# Patient Record
Sex: Female | Born: 2000 | Race: White | Hispanic: No | Marital: Single | Smoking: Never smoker
Health system: Southern US, Community
[De-identification: ages and names within clinical notes are randomized; demographics above are authoritative.]

## PROBLEM LIST (undated history)

## (undated) ENCOUNTER — Inpatient Hospital Stay: Payer: Self-pay

## (undated) DIAGNOSIS — Z3400 Encounter for supervision of normal first pregnancy, unspecified trimester: Secondary | ICD-10-CM

## (undated) DIAGNOSIS — E039 Hypothyroidism, unspecified: Secondary | ICD-10-CM

## (undated) DIAGNOSIS — D649 Anemia, unspecified: Secondary | ICD-10-CM

## (undated) HISTORY — PX: FRACTURE SURGERY: SHX138

---
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
## 2018-06-09 DIAGNOSIS — E039 Hypothyroidism, unspecified: Secondary | ICD-10-CM | POA: Insufficient documentation

## 2019-10-30 ENCOUNTER — Other Ambulatory Visit: Payer: Self-pay

## 2019-10-30 DIAGNOSIS — Z20822 Contact with and (suspected) exposure to covid-19: Secondary | ICD-10-CM

## 2019-11-01 ENCOUNTER — Telehealth: Payer: Self-pay | Admitting: Pediatrics

## 2019-11-01 LAB — NOVEL CORONAVIRUS, NAA: SARS-CoV-2, NAA: NOT DETECTED

## 2019-11-01 NOTE — Telephone Encounter (Signed)
Negative COVID results given. Patient results "NOT Detected." Caller expressed understanding. ° °

## 2020-03-13 ENCOUNTER — Emergency Department
Admission: EM | Admit: 2020-03-13 | Discharge: 2020-03-13 | Disposition: A | Discharge status: Home / Self Care | Payer: Medicaid Other | Attending: Emergency Medicine | Admitting: Emergency Medicine

## 2020-03-13 ENCOUNTER — Other Ambulatory Visit: Payer: Self-pay

## 2020-03-13 ENCOUNTER — Emergency Department: Payer: Medicaid Other

## 2020-03-13 ENCOUNTER — Encounter: Payer: Self-pay | Admitting: Emergency Medicine

## 2020-03-13 DIAGNOSIS — Z79899 Other long term (current) drug therapy: Secondary | ICD-10-CM | POA: Insufficient documentation

## 2020-03-13 DIAGNOSIS — N3000 Acute cystitis without hematuria: Secondary | ICD-10-CM | POA: Diagnosis not present

## 2020-03-13 DIAGNOSIS — R109 Unspecified abdominal pain: Secondary | ICD-10-CM | POA: Diagnosis present

## 2020-03-13 LAB — CBC
HCT: 41.7 % (ref 36.0–46.0)
Hemoglobin: 14 g/dL (ref 12.0–15.0)
MCH: 29.7 pg (ref 26.0–34.0)
MCHC: 33.6 g/dL (ref 30.0–36.0)
MCV: 88.3 fL (ref 80.0–100.0)
Platelets: 303 10*3/uL (ref 150–400)
RBC: 4.72 MIL/uL (ref 3.87–5.11)
RDW: 12.3 % (ref 11.5–15.5)
WBC: 8.5 10*3/uL (ref 4.0–10.5)
nRBC: 0 % (ref 0.0–0.2)

## 2020-03-13 LAB — URINALYSIS, COMPLETE (UACMP) WITH MICROSCOPIC
Bilirubin Urine: NEGATIVE
Glucose, UA: NEGATIVE mg/dL
Hgb urine dipstick: NEGATIVE
Ketones, ur: 20 mg/dL — AB
Nitrite: POSITIVE — AB
Protein, ur: 30 mg/dL — AB
Specific Gravity, Urine: 1.023 (ref 1.005–1.030)
pH: 5 (ref 5.0–8.0)

## 2020-03-13 LAB — COMPREHENSIVE METABOLIC PANEL
ALT: 18 U/L (ref 0–44)
AST: 17 U/L (ref 15–41)
Albumin: 4 g/dL (ref 3.5–5.0)
Alkaline Phosphatase: 67 U/L (ref 38–126)
Anion gap: 11 (ref 5–15)
BUN: 9 mg/dL (ref 6–20)
CO2: 24 mmol/L (ref 22–32)
Calcium: 9.4 mg/dL (ref 8.9–10.3)
Chloride: 103 mmol/L (ref 98–111)
Creatinine, Ser: 0.6 mg/dL (ref 0.44–1.00)
GFR calc Af Amer: >60 mL/min (ref >60)
GFR calc non Af Amer: >60 mL/min (ref >60)
Glucose, Bld: 97 mg/dL (ref 70–99)
Potassium: 4.3 mmol/L (ref 3.5–5.1)
Sodium: 138 mmol/L (ref 135–145)
Total Bilirubin: 0.7 mg/dL (ref 0.3–1.2)
Total Protein: 7.7 g/dL (ref 6.5–8.1)

## 2020-03-13 LAB — LIPASE, BLOOD: Lipase: 26 U/L (ref 11–51)

## 2020-03-13 LAB — POCT PREGNANCY, URINE: Preg Test, Ur: NEGATIVE

## 2020-03-13 MED ORDER — TRAMADOL HCL 50 MG PO TABS: 50.0000 mg | ORAL_TABLET | Freq: Four times a day (QID) | ORAL | 0 refills | Status: AC | PRN

## 2020-03-13 MED ORDER — SULFAMETHOXAZOLE-TRIMETHOPRIM 800-160 MG PO TABS: 1.0000 | ORAL_TABLET | Freq: Two times a day (BID) | ORAL | 0 refills | Status: AC

## 2020-03-13 MED ORDER — SULFAMETHOXAZOLE-TRIMETHOPRIM 800-160 MG PO TABS
1.0000 | ORAL_TABLET | Freq: Once | ORAL | Status: AC
  Administered 2020-03-13: 1 via ORAL
  Filled 2020-03-13: qty 1

## 2020-03-13 MED ORDER — ONDANSETRON HCL 4 MG/2ML IJ SOLN
4.0000 mg | Freq: Once | INTRAMUSCULAR | Status: AC
  Administered 2020-03-13: 4 mg via INTRAVENOUS
  Filled 2020-03-13: qty 2

## 2020-03-13 MED ORDER — MORPHINE SULFATE (PF) 2 MG/ML IV SOLN
2.0000 mg | Freq: Once | INTRAVENOUS | Status: AC
  Administered 2020-03-13: 2 mg via INTRAVENOUS
  Filled 2020-03-13: qty 1

## 2020-03-13 NOTE — ED Triage Notes (Signed)
Pt comes POV with "side pains" from left side to stomach to back and shoulder blade. Pt states it started Sunday. Pt was expecting period but it never came. Pt states it feels muscular but it also hurts when she breathes.

## 2020-03-13 NOTE — ED Provider Notes (Signed)
[facility] Emergency Department Provider Note  ____________________________________________   First MD Initiated Contact with Patient 03/13/20 1514     (approximate)  I have reviewed the triage vital signs and the nursing notes.   HISTORY  Chief Complaint side pain    HPI Carol Gentry is a 19 y.o. female with below list of previous medical conditions presents to the emergency department secondary to right-sided abdominal pain with radiation to her back and shoulder since Sunday with associated urinary urgency and frequency.  Patient denies any fever no diarrhea constipation.       Past Medical History:  Diagnosis Date  . Anemia   . Supervision of normal first teen pregnancy     Patient Active Problem List   Diagnosis Date Noted  . Elevated blood pressure affecting pregnancy in third trimester, antepartum 07/13/2017  . Term pregnancy 06/30/2017  . Cramping affecting pregnancy, antepartum 06/07/2017  . Preterm uterine contractions 05/10/2017    Past Surgical History:  Procedure Laterality Date  . FRACTURE SURGERY    . ORIF TOE FRACTURE Right 11/15/2015   Procedure: OPEN REDUCTION INTERNAL FIXATION (ORIF) METATARSAL (TOE) FRACTURE;  Surgeon: Samara Deist, DPM;  Location: [facility] ORS;  Service: Podiatry;  Laterality: Right;    Prior to Admission medications   Medication Sig Start Date End Date Taking? Authorizing Provider  benzocaine-Menthol (DERMOPLAST) 20-0.5 % AERO Apply 1 application topically as needed for irritation (perineal discomfort). 07/17/17   Schermerhorn, Gwen Her, MD  ibuprofen (ADVIL,MOTRIN) 600 MG tablet Take 1 tablet (600 mg total) by mouth every 6 (six) hours. 07/17/17   Schermerhorn, Gwen Her, MD  labetalol (NORMODYNE) 100 MG tablet Take 1 tablet (100 mg total) by mouth 2 (two) times daily. 07/17/17   Schermerhorn, Gwen Her, MD  sulfamethoxazole-trimethoprim (BACTRIM DS) 800-160 MG tablet Take 1 tablet by mouth 2 (two) times  daily for 5 days. 03/13/20 03/18/20  Gregor Hams, MD  traMADol (ULTRAM) 50 MG tablet Take 1 tablet (50 mg total) by mouth every 6 (six) hours as needed. 03/13/20 03/13/21  Gregor Hams, MD    Allergies Amoxicillin  History reviewed. No pertinent family history.  Social History Social History   Tobacco Use  . Smoking status: Never Smoker  . Smokeless tobacco: Never Used  Substance Use Topics  . Alcohol use: No  . Drug use: No    Review of Systems Constitutional: No fever/chills Eyes: No visual changes. ENT: No sore throat. Cardiovascular: Denies chest pain. Respiratory: Denies shortness of breath. Gastrointestinal: Positive for abdominal pain and nausea Genitourinary: Negative for dysuria. Musculoskeletal: Negative for neck pain.  Negative for back pain. Integumentary: Negative for rash. Neurological: Negative for headaches, focal weakness or numbness.   ____________________________________________   PHYSICAL EXAM:  VITAL SIGNS: ED Triage Vitals  Enc Vitals Group     BP 03/13/20 1240 (!) 99/53     Pulse Rate 03/13/20 1240 82     Resp 03/13/20 1240 16     Temp 03/13/20 1240 98.6 F (37 C)     Temp Source 03/13/20 1240 Oral     SpO2 03/13/20 1240 98 %     Weight 03/13/20 1239 48.5 kg (107 lb)     Height 03/13/20 1239 1.549 m (5\' 1" )     Head Circumference --      Peak Flow --      Pain Score 03/13/20 1245 10     Pain Loc --      Pain Edu? --  Excl. in GC? --      Constitutional: Alert and oriented.  Eyes: Conjunctivae are normal.  Mouth/Throat: Patient is wearing a mask. Neck: No stridor.  No meningeal signs.   Cardiovascular: Normal rate, regular rhythm. Good peripheral circulation. Grossly normal heart sounds. Respiratory: Normal respiratory effort.  No retractions. Gastrointestinal: Soft and nontender. No distention.  Musculoskeletal: No lower extremity tenderness nor edema. No gross deformities of extremities. Neurologic:  Normal speech  and language. No gross focal neurologic deficits are appreciated.  Skin:  Skin is warm, dry and intact. Psychiatric: Mood and affect are normal. Speech and behavior are normal.  ____________________________________________   LABS (all labs ordered are listed, but only abnormal results are displayed)  Labs Reviewed  URINALYSIS, COMPLETE (UACMP) WITH MICROSCOPIC - Abnormal; Notable for the following components:      Result Value   Color, Urine YELLOW (*)    APPearance HAZY (*)    Ketones, ur 20 (*)    Protein, ur 30 (*)    Nitrite POSITIVE (*)    Leukocytes,Ua TRACE (*)    Bacteria, UA RARE (*)    All other components within normal limits  LIPASE, BLOOD  COMPREHENSIVE METABOLIC PANEL  CBC  POC URINE PREG, ED  POCT PREGNANCY, URINE   __________________________________________  RADIOLOGY I, Forada N Persephonie Hegwood, personally viewed and evaluated these images (plain radiographs) as part of my medical decision making, as well as reviewing the written report by the radiologist.  ED MD interpretation: No acute findings noted on CT abdomen and pelvis per radiologist.  Official radiology report(s): CT Renal Stone Study  Result Date: 03/13/2020 CLINICAL DATA:  19 year old female with history of left-sided flank pain for the past 4 days. EXAM: CT ABDOMEN AND PELVIS WITHOUT CONTRAST TECHNIQUE: Multidetector CT imaging of the abdomen and pelvis was performed following the standard protocol without IV contrast. COMPARISON:  No priors. FINDINGS: Lower chest: Unremarkable. Hepatobiliary: No suspicious cystic or solid hepatic lesions are confidently identified on today's noncontrast CT examination. Unenhanced appearance of the gallbladder is normal. Pancreas: No definite pancreatic mass or peripancreatic fluid collections or inflammatory changes are noted on today's noncontrast CT examination. Spleen: Unremarkable. Adrenals/Urinary Tract: There are no abnormal calcifications within the collecting system  of either kidney, along the course of either ureter, or within the lumen of the urinary bladder. No hydroureteronephrosis or perinephric stranding to suggest urinary tract obstruction at this time. The unenhanced appearance of the kidneys is unremarkable bilaterally. Unenhanced appearance of the urinary bladder is normal. Bilateral adrenal glands are normal in appearance. Stomach/Bowel: Unenhanced appearance of the stomach is normal. No pathologic dilatation of small bowel or colon. The appendix is not confidently identified and may be surgically absent. Regardless, there are no inflammatory changes noted adjacent to the cecum to suggest the presence of an acute appendicitis at this time. Vascular/Lymphatic: No atherosclerotic calcifications in the abdominal aorta or pelvic vasculature. No lymphadenopathy noted in the abdomen or pelvis. Reproductive: Uterus and ovaries are unremarkable in appearance. Other: No significant volume of ascites.  No pneumoperitoneum. Musculoskeletal: There are no aggressive appearing lytic or blastic lesions noted in the visualized portions of the skeleton. IMPRESSION: 1. No acute findings are noted in the abdomen or pelvis to account for the patient's symptoms. Specifically, no urinary tract calculi or findings of urinary tract obstruction. Electronically Signed   By: Trudie Reed M.D.   On: 03/13/2020 16:12      Procedures   ____________________________________________   INITIAL IMPRESSION / MDM / ASSESSMENT  AND PLAN / ED COURSE  As part of my medical decision making, I reviewed the following data within the electronic MEDICAL RECORD NUMBER  19 year old female presented with above-stated history and physical exam differential diagnosis including but not limited to ureterolithiasis cholelithiasis cystitis and pyelonephritis.  Urinalysis consistent with cystitis.  No CVA tenderness on exam.  CT revealed no acute intra-abdominal finding.  Patient prescribed Bactrim DS for  home.  ____________________________________________  FINAL CLINICAL IMPRESSION(S) / ED DIAGNOSES  Final diagnoses:  Acute cystitis without hematuria     MEDICATIONS GIVEN DURING THIS VISIT:  Medications  morphine 2 MG/ML injection 2 mg (2 mg Intravenous Given 03/13/20 1536)  ondansetron (ZOFRAN) injection 4 mg (4 mg Intravenous Given 03/13/20 1535)  sulfamethoxazole-trimethoprim (BACTRIM DS) 800-160 MG per tablet 1 tablet (1 tablet Oral Given 03/13/20 1658)     ED Discharge Orders         Ordered    sulfamethoxazole-trimethoprim (BACTRIM DS) 800-160 MG tablet  2 times daily     03/13/20 1631    traMADol (ULTRAM) 50 MG tablet  Every 6 hours PRN     03/13/20 1632          *Please note:  Carol Gentry was evaluated in Emergency Department on 03/13/2020 for the symptoms described in the history of present illness. She was evaluated in the context of the global COVID-19 pandemic, which necessitated consideration that the patient might be at risk for infection with the SARS-CoV-2 virus that causes COVID-19. Institutional protocols and algorithms that pertain to the evaluation of patients at risk for COVID-19 are in a state of rapid change based on information released by regulatory bodies including the CDC and federal and state organizations. These policies and algorithms were followed during the patient's care in the ED.  Some ED evaluations and interventions may be delayed as a result of limited staffing during the pandemic.*  Note:  This document was prepared using Dragon voice recognition software and may include unintentional dictation errors.   Darci Current, MD 03/13/20 2124

## 2020-10-15 IMAGING — CT CT RENAL STONE PROTOCOL
3 of 4 series · 8 of 46 positions shown, 13 images · non-contrast
Comparison: No priors.

CLINICAL DATA: 18-year-old female with history of left-sided flank
pain for the past 4 days.

EXAM:
CT ABDOMEN AND PELVIS WITHOUT CONTRAST
TECHNIQUE: Multidetector CT imaging of the abdomen and pelvis was performed
following the standard protocol without IV contrast.

[Series 4: lung bases · axial · 0.57mm/px · z∈[-141,-81]mm · 4 of 20 slices shown, 9 images]
[im 4/20  soft-tissue]
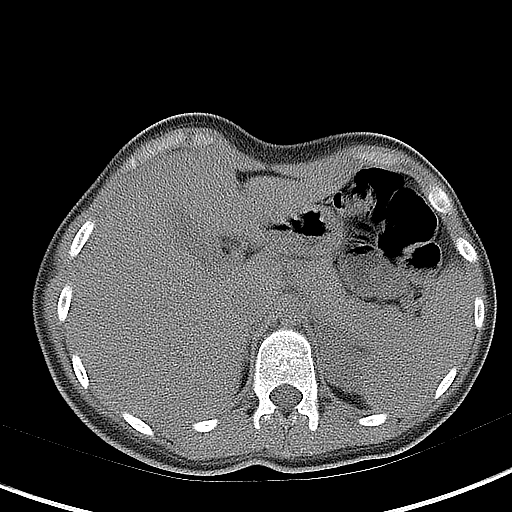
[im 4/20  lung]
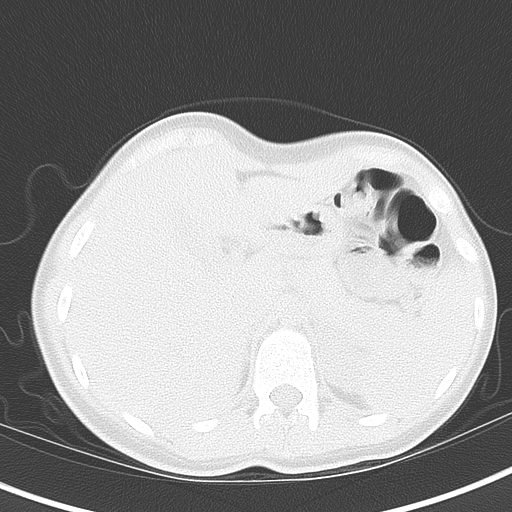
[im 4/20  bone]
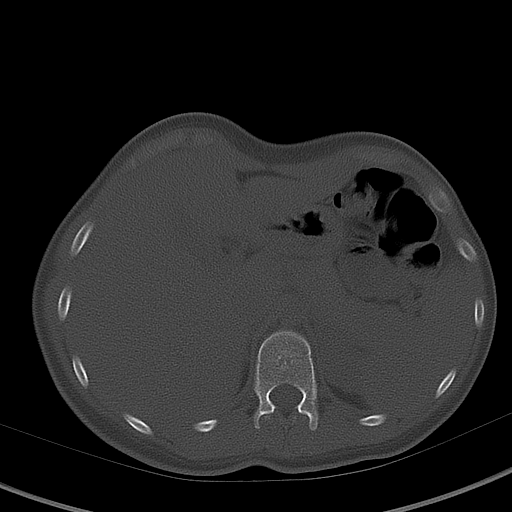
[im 8/20  soft-tissue]
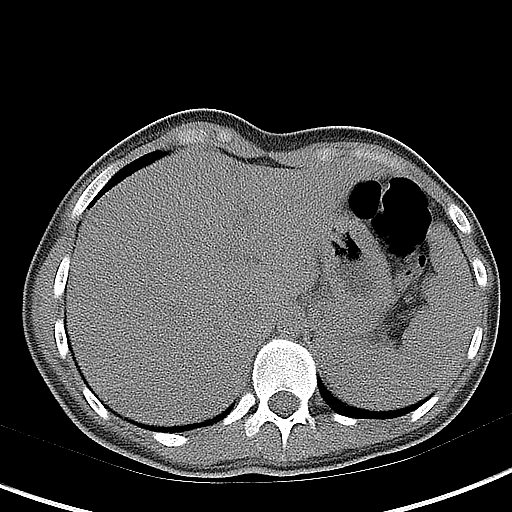
[im 8/20  lung]
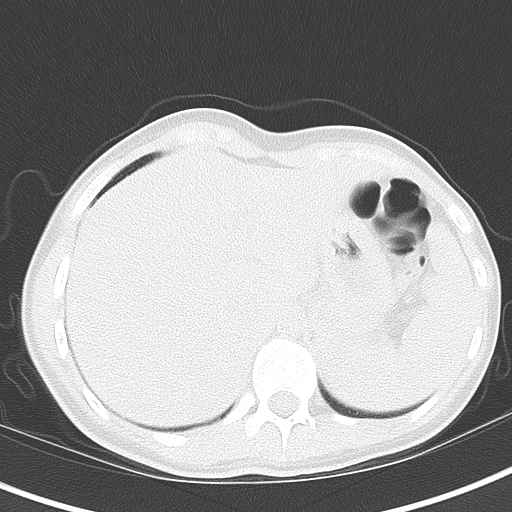
[im 12/20  soft-tissue]
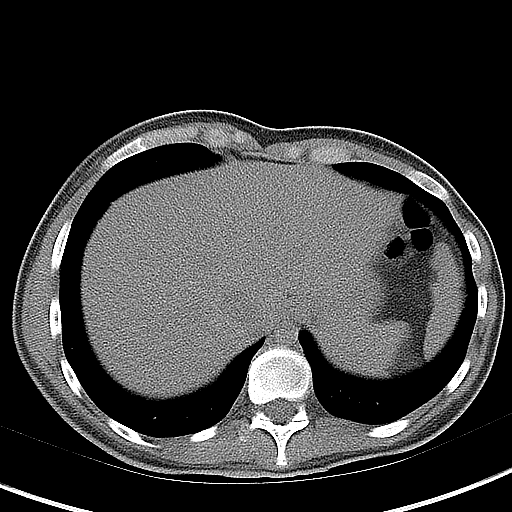
[im 12/20  lung]
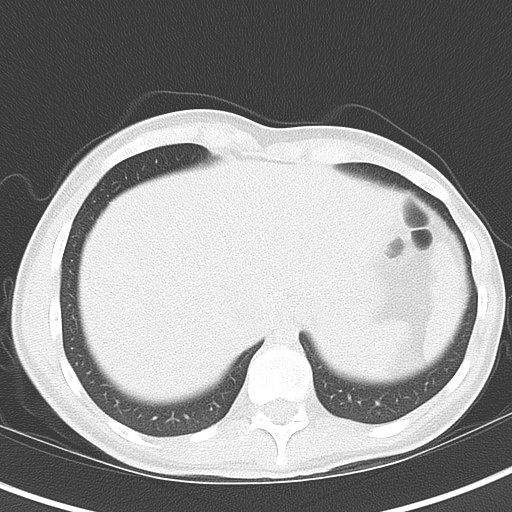
[im 16/20  soft-tissue]
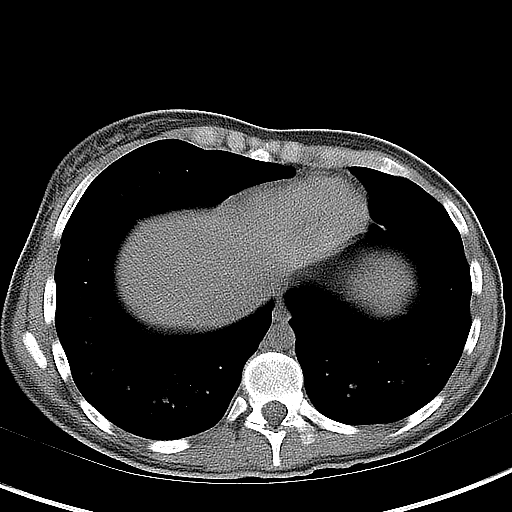
[im 16/20  lung]
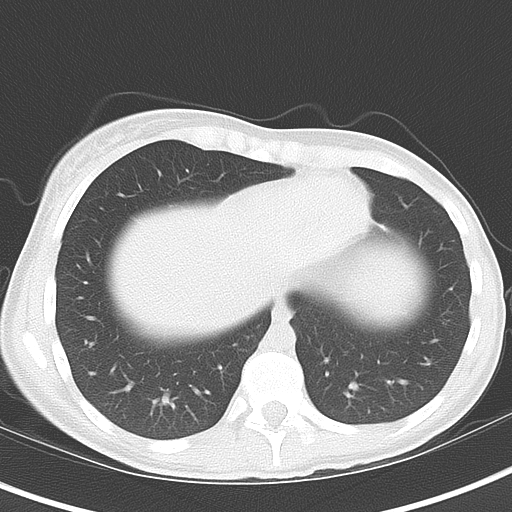

[Series 5: coronal · coronal · 0.61mm/px · 3 of 111 slices shown]
[im 37/111  soft-tissue]
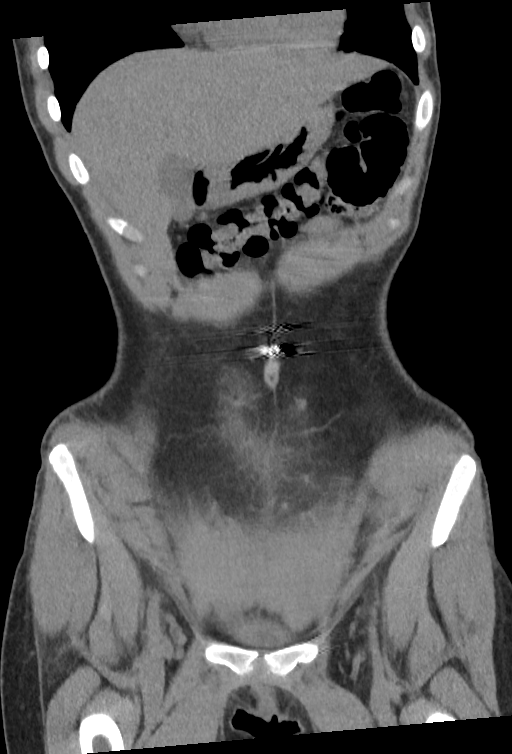
[im 49/111  soft-tissue]
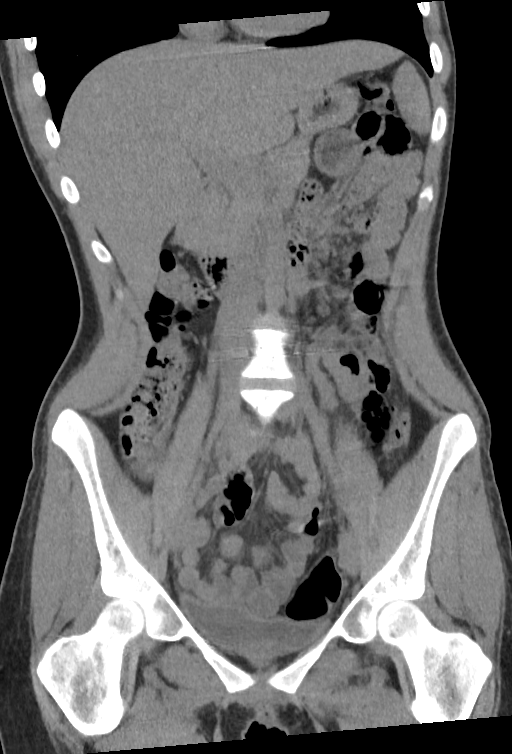
[im 62/111  soft-tissue]
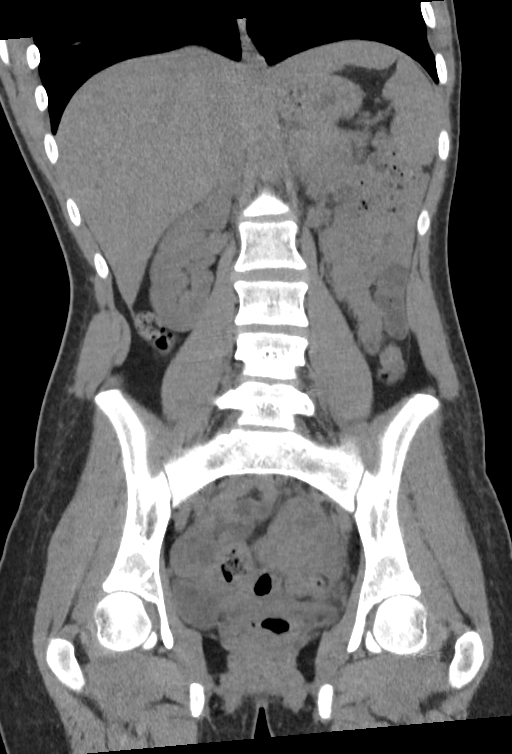

[Series 6: sagittal · sagittal · 0.40mm/px · 1 of 173 slices shown]
[im 58/173  soft-tissue]
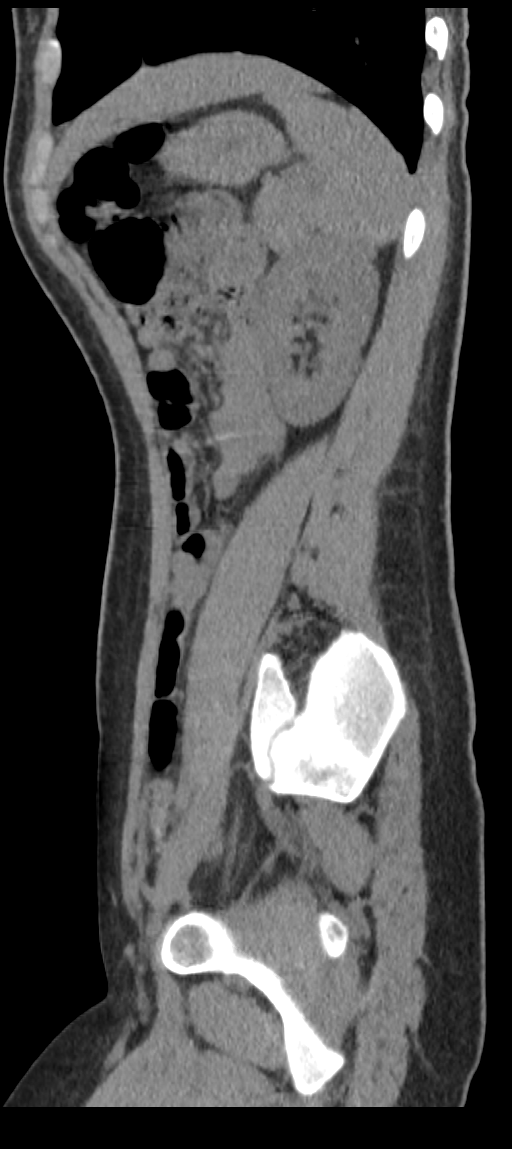

[8 of 46 positions shown; findings below may reference images not displayed]

FINDINGS: Lower chest: Unremarkable.

Hepatobiliary: No suspicious cystic or solid hepatic lesions are
confidently identified on today's noncontrast CT examination.
Unenhanced appearance of the gallbladder is normal.

Pancreas: No definite pancreatic mass or peripancreatic fluid
collections or inflammatory changes are noted on today's noncontrast
CT examination.

Spleen: Unremarkable.

Adrenals/Urinary Tract: There are no abnormal calcifications within
the collecting system of either kidney, along the course of either
ureter, or within the lumen of the urinary bladder. No
hydroureteronephrosis or perinephric stranding to suggest urinary
tract obstruction at this time. The unenhanced appearance of the
kidneys is unremarkable bilaterally. Unenhanced appearance of the
urinary bladder is normal. Bilateral adrenal glands are normal in
appearance.

Stomach/Bowel: Unenhanced appearance of the stomach is normal. No
pathologic dilatation of small bowel or colon. The appendix is not
confidently identified and may be surgically absent. Regardless,
there are no inflammatory changes noted adjacent to the cecum to
suggest the presence of an acute appendicitis at this time.

Vascular/Lymphatic: No atherosclerotic calcifications in the
abdominal aorta or pelvic vasculature. No lymphadenopathy noted in
the abdomen or pelvis.

Reproductive: Uterus and ovaries are unremarkable in appearance.

Other: No significant volume of ascites.  No pneumoperitoneum.

Musculoskeletal: There are no aggressive appearing lytic or blastic
lesions noted in the visualized portions of the skeleton.
IMPRESSION: 1. No acute findings are noted in the abdomen or pelvis to account
for the patient's symptoms. Specifically, no urinary tract calculi
or findings of urinary tract obstruction.

## 2020-12-28 NOTE — L&D Delivery Note (Signed)
Delivery Note  Carol Gentry is a [id number] at [redacted]w[redacted]d with an LMP of 07/18/2021, not consistent with [place] at [redacted]w[redacted]d, with an Estimated Date of Delivery: 08/05/21.   First Stage: Labor onset: 0530 Augmentation: AROM Analgesia /Anesthesia intrapartum: Epidural  AROM at 0954  Second Stage: Complete dilation at 1111 Onset of pushing at 1112 FHR second stage 135 with moderate variability, intermittent lates and variables with pushing   [name] presented to L&D with regular, painful contractions in advanced labor.  She was expectantly managed, AROM for large amount of clear fluid after epidural.  She progressed well to C/C/+2 with an urge to push.  She pushed effectively over approximately 20 minutes (4 contractions over 20 minutes) for a spontaneous vaginal birth.   Delivery of a viable baby boy on 08/04/2021 at 1132 by CNM Delivery of fetal head in OA position with restitution to LOP. No nuchal cord;  Anterior then posterior shoulders delivered easily with gentle downward traction. Baby placed on mom's chest, and attended to by baby RN Cord double clamped after cessation of pulsation, cut by father of baby.  Cord blood sample collected: O pos   Third Stage: Oxytocin bolus started after delivery of infant for hemorrhage prophylaxis  Placenta delivered intact with 3 VC @ 1140 Placenta disposition: discarded  Uterine tone firm / bleeding small to moderate  No laceration identified - small 2cm by 2cm hematoma noted at introitus.  Reviewed size and location with L&D nurse and discussed with patient and family.  Instructed to use ice and pressure for at least 24 hours.   Anesthesia for repair: N/A Repair N/A Est. Blood Loss (mL):   Complications: None   Mom to postpartum.  Baby to Couplet care / Skin to Skin.  Newborn: Information for the patient's newborn:  [name], [name] [[id number]]  Live born female  Birth Weight: Pending   APGAR: 8, 9  Newborn Delivery   Birth date/time: 08/04/2021  11:32:00 Delivery type: Vaginal, Spontaneous      Feeding planned: breast   ---------- Margaretmary Eddy, CNM Certified Nurse Midwife [facility]

## 2021-01-16 DIAGNOSIS — Z3493 Encounter for supervision of normal pregnancy, unspecified, third trimester: Secondary | ICD-10-CM | POA: Insufficient documentation

## 2021-01-22 LAB — OB RESULTS CONSOLE HEPATITIS B SURFACE ANTIGEN: Hepatitis B Surface Ag: NEGATIVE

## 2021-01-22 LAB — OB RESULTS CONSOLE RUBELLA ANTIBODY, IGM: Rubella: IMMUNE

## 2021-01-22 LAB — OB RESULTS CONSOLE VARICELLA ZOSTER ANTIBODY, IGG: Varicella: IMMUNE

## 2021-01-30 DIAGNOSIS — F319 Bipolar disorder, unspecified: Secondary | ICD-10-CM | POA: Insufficient documentation

## 2021-07-09 LAB — OB RESULTS CONSOLE HIV ANTIBODY (ROUTINE TESTING): HIV: NONREACTIVE

## 2021-07-09 LAB — OB RESULTS CONSOLE GC/CHLAMYDIA
Chlamydia: NEGATIVE
Gonorrhea: NEGATIVE

## 2021-07-09 LAB — OB RESULTS CONSOLE GBS: GBS: NEGATIVE

## 2021-07-09 LAB — OB RESULTS CONSOLE RPR: RPR: NONREACTIVE

## 2021-07-15 ENCOUNTER — Observation Stay
Admission: EM | Admit: 2021-07-15 | Discharge: 2021-07-15 | Disposition: A | Discharge status: Home / Self Care | Payer: BC Managed Care – PPO | Attending: Obstetrics and Gynecology | Admitting: Obstetrics and Gynecology

## 2021-07-15 ENCOUNTER — Encounter: Payer: Self-pay | Admitting: Obstetrics and Gynecology

## 2021-07-15 ENCOUNTER — Other Ambulatory Visit: Payer: Self-pay

## 2021-07-15 DIAGNOSIS — Z3490 Encounter for supervision of normal pregnancy, unspecified, unspecified trimester: Secondary | ICD-10-CM

## 2021-07-15 DIAGNOSIS — O471 False labor at or after 37 completed weeks of gestation: Principal | ICD-10-CM | POA: Insufficient documentation

## 2021-07-15 DIAGNOSIS — Z3A37 37 weeks gestation of pregnancy: Secondary | ICD-10-CM | POA: Insufficient documentation

## 2021-07-15 LAB — URINALYSIS, COMPLETE (UACMP) WITH MICROSCOPIC
Bilirubin Urine: NEGATIVE
Glucose, UA: 50 mg/dL — AB
Hgb urine dipstick: NEGATIVE
Ketones, ur: NEGATIVE mg/dL
Nitrite: NEGATIVE
Protein, ur: NEGATIVE mg/dL
Specific Gravity, Urine: 1.013 (ref 1.005–1.030)
pH: 7 (ref 5.0–8.0)

## 2021-07-15 MED ORDER — OXYCODONE-ACETAMINOPHEN 5-325 MG PO TABS
1.0000 | ORAL_TABLET | Freq: Once | ORAL | Status: AC
  Administered 2021-07-15: 1 via ORAL
  Filled 2021-07-15: qty 1

## 2021-07-15 NOTE — OB Triage Note (Signed)
Pt Carol Gentry 19 y.o. presents to labor and delivery triage reporting contractions  . Pt is a G3P1011 at [redacted]w[redacted]d. Pt denies signs and symptons consistent with rupture of membranes or active vaginal bleeding. Pt reports contractions and states positive fetal movement. Pt describes pain in upper abdomen rating pain 8/10 and lower back pain 7/10 that gets worse when standing, and having discomfort with starting urinary streams.  External FM and TOCO applied to non-tender abdomen and assessing. Initial FHR 155 . Vital signs obtained and within normal limits. Provider notified of pt.

## 2021-07-15 NOTE — OB Triage Note (Signed)
Discharge instructions provided to pt. Reviewed follow up care with pt. Education provided about labor symptoms.Pt verbalizes understanding. Vaginal bleeding and discharge, contractions, and fetal movement reviewed by RN. Pt discharged home with significant other who will be driving the pt after medication administration. (See MAR)

## 2021-07-15 NOTE — Discharge Summary (Signed)
[redacted] weeks EGA with uterine ctx  No LF , vaginal bleeding   Cx 2.5 cm , no change after 1.5 observation . Reassuring fetal monitoring   Give percocet 5/325 mg po and d/c home  with precautions

## 2021-07-16 ENCOUNTER — Observation Stay
Admission: EM | Admit: 2021-07-16 | Discharge: 2021-07-16 | Disposition: A | Discharge status: Home / Self Care | Payer: BC Managed Care – PPO

## 2021-07-16 ENCOUNTER — Encounter: Payer: Self-pay | Admitting: Obstetrics and Gynecology

## 2021-07-16 ENCOUNTER — Other Ambulatory Visit: Payer: Self-pay

## 2021-07-16 DIAGNOSIS — E039 Hypothyroidism, unspecified: Secondary | ICD-10-CM | POA: Diagnosis not present

## 2021-07-16 DIAGNOSIS — Z7982 Long term (current) use of aspirin: Secondary | ICD-10-CM | POA: Insufficient documentation

## 2021-07-16 DIAGNOSIS — O26853 Spotting complicating pregnancy, third trimester: Principal | ICD-10-CM | POA: Insufficient documentation

## 2021-07-16 DIAGNOSIS — Z3A37 37 weeks gestation of pregnancy: Secondary | ICD-10-CM | POA: Diagnosis not present

## 2021-07-16 DIAGNOSIS — Z79899 Other long term (current) drug therapy: Secondary | ICD-10-CM | POA: Insufficient documentation

## 2021-07-16 DIAGNOSIS — O4693 Antepartum hemorrhage, unspecified, third trimester: Secondary | ICD-10-CM | POA: Diagnosis present

## 2021-07-16 DIAGNOSIS — N83299 Other ovarian cyst, unspecified side: Secondary | ICD-10-CM | POA: Insufficient documentation

## 2021-07-16 DIAGNOSIS — R102 Pelvic and perineal pain: Secondary | ICD-10-CM | POA: Insufficient documentation

## 2021-07-16 MED ORDER — CALCIUM CARBONATE ANTACID 500 MG PO CHEW: 2.0000 | CHEWABLE_TABLET | ORAL | Status: DC | PRN

## 2021-07-16 MED ORDER — ACETAMINOPHEN 500 MG PO TABS: 1000.0000 mg | ORAL_TABLET | Freq: Four times a day (QID) | ORAL | Status: DC | PRN

## 2021-07-16 NOTE — Discharge Summary (Signed)
RN reviewed discharge instructions with patient. Gave patient opportunity for questions. All questions answered at this time. Pt verbalized understanding. Pt discharged home. 

## 2021-07-16 NOTE — OB Triage Note (Signed)
Pt presents c/o small amount of bright red bleeding. Pt states every time she goes to the bathroom and wipes "there is more." Pt reports positive fetal movement. Pt denies LOF. Or ctx. Pt denies recent intercourse. Pt states she was here yesterday for ctx but didn't change her cervix so she got sent home. She is back because the bleeding was concerning to her. VSS. Will continue to monitor.

## 2021-07-16 NOTE — Discharge Summary (Signed)
Carol Gentry is a 20 y.o. female. She is at [redacted]w[redacted]d gestation. No LMP recorded. Patient is pregnant. Estimated Date of Delivery: 08/05/21  Prenatal care site: [facility]  Chief complaint: Vaginal bleeding in third trimester.  HPI: Carol Gentry presents to L&D with complaints of light pink vaginal bleeding.  She was evaluated for labor last night and had brown discharge after her cervical exam.  She noticed pink to red when wiping this morning and had a scant amount in her underwear.  She denies contractions or LOF.  Endorses good fetal movement.   Factors complicating pregnancy: History of bipolar depression Hypothyroidism - takes synthroid  History of preeclampsia with previous pregnancy   S: Resting comfortably. no CTX, no LOF,  Active fetal movement.   Maternal Medical History:  Past Medical Hx:  has a past medical history of Anemia and Supervision of normal first teen pregnancy.    Past Surgical Hx:  has a past surgical history that includes ORIF toe fracture (Right, 11/15/2015) and Fracture surgery.   Allergies  Allergen Reactions   Amoxicillin Hives     Prior to Admission medications   Medication Sig Start Date End Date Taking? Authorizing Provider  aspirin 81 MG chewable tablet Chew by mouth daily.   Yes [provider]  levothyroxine (SYNTHROID) 200 MCG tablet Take 200 mcg by mouth daily before breakfast.   Yes [provider]  Prenatal Vit-Fe Fumarate-FA (PRENATAL MULTIVITAMIN) TABS tablet Take 1 tablet by mouth daily at 12 noon.   Yes [provider]  benzocaine-Menthol (DERMOPLAST) 20-0.5 % AERO Apply 1 application topically as needed for irritation (perineal discomfort). Patient not taking: Reported on 07/15/2021 07/17/17   Schermerhorn, Ihor Austin, MD  ibuprofen (ADVIL,MOTRIN) 600 MG tablet Take 1 tablet (600 mg total) by mouth every 6 (six) hours. Patient not taking: Reported on 07/15/2021 07/17/17   Schermerhorn, Ihor Austin, MD  labetalol  (NORMODYNE) 100 MG tablet Take 1 tablet (100 mg total) by mouth 2 (two) times daily. Patient not taking: Reported on 07/15/2021 07/17/17   Schermerhorn, Ihor Austin, MD    Social History: She  reports that she has never smoked. She has never used smokeless tobacco. She reports that she does not drink alcohol and does not use drugs.  Family History: Family history non-contributory, no history of gyn cancers  Review of Systems: A full review of systems was performed and negative except as noted in the HPI.    O:  BP (!) 111/57 (BP Location: Left Arm)   Pulse 89   Temp 98.2 F (36.8 C) (Oral)   Resp 16   Ht 5\' 1"  (1.549 m)   Wt 70.3 kg   SpO2 100%   BMI 29.29 kg/m  Results for orders placed or performed during the hospital encounter of 07/15/21 (from the past 48 hour(s))  Urinalysis, Complete w Microscopic Urine, Clean Catch   Collection Time: 07/15/21  9:35 PM  Result Value Ref Range   Color, Urine YELLOW (A) YELLOW   APPearance HAZY (A) CLEAR   Specific Gravity, Urine 1.013 1.005 - 1.030   pH 7.0 5.0 - 8.0   Glucose, UA 50 (A) NEGATIVE mg/dL   Hgb urine dipstick NEGATIVE NEGATIVE   Bilirubin Urine NEGATIVE NEGATIVE   Ketones, ur NEGATIVE NEGATIVE mg/dL   Protein, ur NEGATIVE NEGATIVE mg/dL   Nitrite NEGATIVE NEGATIVE   Leukocytes,Ua LARGE (A) NEGATIVE   RBC / HPF 0-5 0 - 5 RBC/hpf   WBC, UA 11-20 0 - 5 WBC/hpf  Bacteria, UA RARE (A) NONE SEEN   Squamous Epithelial / LPF 11-20 0 - 5   Mucus PRESENT       Constitutional: NAD, AAOx3  HE/ENT: extraocular movements grossly intact, moist mucous membranes CV: RRR PULM: nl respiratory effort, CTABL Abd: gravid, non-tender, non-distended, soft  Ext: Non-tender, Nonedmeatous Psych: mood appropriate, speech normal Pelvic :  small amount of brown to red discharge noted, no clots or active bleeding  SVE: Dilation: 2.5 Effacement (%): 40, 50 Cervical Position: Middle Station: -2 Presentation: Vertex Exam by:: Margaretmary Eddy CNM    Fetal Monitor: Baseline: 130 bpm Variability: moderate Accels: Present Decels: none Toco: none  Category: I   Assessment: 20 y.o. [redacted]w[redacted]d here for antenatal surveillance during pregnancy.  Principle diagnosis: Vaginal bleeding in third trimester    Plan: Labor: not present.  Fetal Wellbeing: Reassuring Cat 1 tracing. Reactive NST  Discharge c/w with cervical irritation from cervical exam.  Bleeding precautions reviewed Pelvic rest for next 48 hours.   D/c home stable, precautions reviewed, follow-up as scheduled.   ----- Margaretmary Eddy, CNM Certified Nurse Midwife [place]  [facility]

## 2021-07-17 LAB — URINE CULTURE: Culture: >=100000 — AB

## 2021-07-27 ENCOUNTER — Other Ambulatory Visit: Payer: Self-pay

## 2021-07-27 ENCOUNTER — Encounter: Payer: Self-pay | Admitting: Obstetrics and Gynecology

## 2021-07-27 ENCOUNTER — Observation Stay
Admission: EM | Admit: 2021-07-27 | Discharge: 2021-07-27 | Disposition: A | Discharge status: Home / Self Care | Payer: BC Managed Care – PPO | Attending: Certified Nurse Midwife | Admitting: Certified Nurse Midwife

## 2021-07-27 DIAGNOSIS — O479 False labor, unspecified: Secondary | ICD-10-CM | POA: Diagnosis present

## 2021-07-27 DIAGNOSIS — O471 False labor at or after 37 completed weeks of gestation: Secondary | ICD-10-CM | POA: Diagnosis not present

## 2021-07-27 DIAGNOSIS — O2343 Unspecified infection of urinary tract in pregnancy, third trimester: Secondary | ICD-10-CM | POA: Insufficient documentation

## 2021-07-27 DIAGNOSIS — B9620 Unspecified Escherichia coli [E. coli] as the cause of diseases classified elsewhere: Secondary | ICD-10-CM | POA: Insufficient documentation

## 2021-07-27 DIAGNOSIS — Z3A38 38 weeks gestation of pregnancy: Secondary | ICD-10-CM | POA: Insufficient documentation

## 2021-07-27 HISTORY — DX: Hypothyroidism, unspecified: E03.9

## 2021-07-27 LAB — WET PREP, GENITAL
Sperm: NONE SEEN
Trich, Wet Prep: NONE SEEN
Yeast Wet Prep HPF POC: NONE SEEN

## 2021-07-27 MED ORDER — METRONIDAZOLE 500 MG PO TABS: 500.0000 mg | ORAL_TABLET | Freq: Two times a day (BID) | ORAL | 0 refills | Status: AC

## 2021-07-27 NOTE — OB Triage Note (Signed)
Pt is a G3P1011 and 104w5d presenting to L&D with c/o ctx that started at 0130 today. Pt reports pain 7/10 on a 0-10 pain scale. Pt reports taking 1,000mg  of Tylenol at 0145 with minimal relief. Pt confirms positive fetal movement and denies VB and LOF. Monitors applied and assessing.

## 2021-07-27 NOTE — Discharge Summary (Addendum)
Patient ID: ZHANA JEANGILLES MRN: [id number] DOB/AGE: Mar 05, 2001 20 y.o.  Admit date: 07/27/2021 Discharge date: 07/27/2021  Admission Diagnoses: G3P1011 and [redacted]w[redacted]d presenting with UCs starting at 0130  Factors complicating pregnancy: H/o mental health diagnoses Thyroid Disease UTI- E. Coli at NOB H/o Pre-E with last pregnancy around 39 weeks Palpitations at 16wks POS GBS IN URINE, TREATED WITH MACROBID 07/21/21  Discharge Diagnoses: False vs early labor  Prenatal Procedures: NST  Consults: None  Significant Diagnostic Studies:  Results for orders placed or performed during the hospital encounter of 07/27/21 (from the past 168 hour(s))  Wet prep, genital   Collection Time: 07/27/21  6:24 AM  Result Value Ref Range   Yeast Wet Prep HPF POC NONE SEEN NONE SEEN   Trich, Wet Prep NONE SEEN NONE SEEN   Clue Cells Wet Prep HPF POC PRESENT (A) NONE SEEN   WBC, Wet Prep HPF POC MANY (A) NONE SEEN   Sperm NONE SEEN     Treatments: antibiotics: metronidazole  Hospital Course:  This is a 20 y.o. G3P1011 with IUP at [redacted]w[redacted]d seen for labor check, noted to have a cervical exam of 2/50/-2.  No leaking of fluid and no bleeding.  She was observed, fetal heart rate monitoring remained reassuring, and she had no signs/symptoms of progressing labor or other maternal-fetal concerns.  Her cervical exam was unchanged from admission.  She was deemed stable for discharge to home with outpatient follow up.  Discharge Physical Exam:  BP 116/63 (BP Location: Right Arm)   Pulse (!) 107   Temp 98 F (36.7 C) (Oral)   Resp 16   Ht 5\' 1"  (1.549 m)   Wt 74.8 kg   BMI 31.18 kg/m   General: NAD CV: RRR Pulm: CTABL, nl effort ABD: s/nd/nt, gravid DVT Evaluation: LE non-ttp, no evidence of DVT on exam.  NST: FHR baseline: 125 bpm Variability: moderate Accelerations: yes Decelerations: none Category/reactivity: reactive  TOCO: quiet SVE: deferred  Dilation: 2 Effacement (%): 50 Cervical  Position: Posterior, Middle Station: -2 Presentation: Vertex Exam by:: [email or web address] RN   Discharge Condition: Stable  Disposition: Discharge disposition: 01-Home or Self Care       Allergies as of 07/27/2021       Reactions   Amoxicillin Hives        Medication List     TAKE these medications    aspirin 81 MG chewable tablet Chew by mouth daily.   levothyroxine 200 MCG tablet Commonly known as: SYNTHROID Take 200 mcg by mouth daily before breakfast.   metroNIDAZOLE 500 MG tablet Commonly known as: Flagyl Take 1 tablet (500 mg total) by mouth 2 (two) times daily for 7 days.   prenatal multivitamin Tabs tablet Take 1 tablet by mouth daily at 12 noon.         Signed8/01/2021, CNM 07/27/2021 12:12 PM

## 2021-07-27 NOTE — Discharge Instructions (Signed)
Please keep your next scheduled appointment. If you have questions or concerns you may call the on call provider.  If you have urgent concerns go to your nearest emergency department for evaluation.

## 2021-08-01 ENCOUNTER — Other Ambulatory Visit: Payer: Self-pay | Admitting: Obstetrics and Gynecology

## 2021-08-01 NOTE — Progress Notes (Signed)
Carol Gentry is a 20 y.o. G34P1011 female at [redacted]w[redacted]d dated by 8 week u/s.  She presents to L&D for elective IOL at term  Pregnancy Issues: Bipolar depression Thyroid Disease Levothyroxine daily UTI- E. Coli at [facility] H/o Pre-E with last pregnancy around 39 weeks POS GBS IN URINE, TREATED WITH MACROBID 07/21/21   Prenatal care site: [facility]   EFW: 07/17/21 3134 g @53 %   Pertinent Results:  Prenatal Labs: Blood type/Rh O Pos  Antibody screen neg  Rubella Immune  Varicella Immune  RPR NR  HBsAg Neg  HIV NR  GC neg  Chlamydia neg  Genetic screening negative  1 hour GTT 74  3 hour GTT   GBS  Pos by urine at [facility] in July    5. Post Partum Planning: - Infant feeding: unknown - Contraception: unknown - Tdap Given 05/15/21 - Flu 04/17/21   Brownie Gockel, CNM 08/01/2021 8:28 AM

## 2021-08-04 ENCOUNTER — Inpatient Hospital Stay: Payer: BC Managed Care – PPO | Admitting: Anesthesiology

## 2021-08-04 ENCOUNTER — Inpatient Hospital Stay
Admission: EM | Admit: 2021-08-04 | Discharge: 2021-08-05 | DRG: 806 | Disposition: A | Discharge status: Home / Self Care | Payer: BC Managed Care – PPO

## 2021-08-04 ENCOUNTER — Encounter: Payer: Self-pay | Admitting: Obstetrics and Gynecology

## 2021-08-04 ENCOUNTER — Other Ambulatory Visit: Payer: Self-pay

## 2021-08-04 DIAGNOSIS — F3130 Bipolar disorder, current episode depressed, mild or moderate severity, unspecified: Secondary | ICD-10-CM | POA: Diagnosis present

## 2021-08-04 DIAGNOSIS — Z3A39 39 weeks gestation of pregnancy: Secondary | ICD-10-CM

## 2021-08-04 DIAGNOSIS — O99824 Streptococcus B carrier state complicating childbirth: Secondary | ICD-10-CM | POA: Diagnosis present

## 2021-08-04 DIAGNOSIS — E039 Hypothyroidism, unspecified: Secondary | ICD-10-CM | POA: Diagnosis present

## 2021-08-04 DIAGNOSIS — Z3493 Encounter for supervision of normal pregnancy, unspecified, third trimester: Secondary | ICD-10-CM

## 2021-08-04 DIAGNOSIS — O26893 Other specified pregnancy related conditions, third trimester: Secondary | ICD-10-CM | POA: Diagnosis present

## 2021-08-04 DIAGNOSIS — D62 Acute posthemorrhagic anemia: Secondary | ICD-10-CM | POA: Diagnosis not present

## 2021-08-04 DIAGNOSIS — Z20822 Contact with and (suspected) exposure to covid-19: Secondary | ICD-10-CM | POA: Diagnosis present

## 2021-08-04 DIAGNOSIS — O479 False labor, unspecified: Secondary | ICD-10-CM

## 2021-08-04 DIAGNOSIS — O99344 Other mental disorders complicating childbirth: Secondary | ICD-10-CM | POA: Diagnosis present

## 2021-08-04 DIAGNOSIS — O9081 Anemia of the puerperium: Secondary | ICD-10-CM | POA: Diagnosis not present

## 2021-08-04 DIAGNOSIS — O99284 Endocrine, nutritional and metabolic diseases complicating childbirth: Secondary | ICD-10-CM | POA: Diagnosis present

## 2021-08-04 DIAGNOSIS — Z3490 Encounter for supervision of normal pregnancy, unspecified, unspecified trimester: Secondary | ICD-10-CM

## 2021-08-04 LAB — CBC
HCT: 37.7 % (ref 36.0–46.0)
Hemoglobin: 12.2 g/dL (ref 12.0–15.0)
MCH: 23.8 pg — ABNORMAL LOW (ref 26.0–34.0)
MCHC: 32.4 g/dL (ref 30.0–36.0)
MCV: 73.5 fL — ABNORMAL LOW (ref 80.0–100.0)
Platelets: 308 10*3/uL (ref 150–400)
RBC: 5.13 MIL/uL — ABNORMAL HIGH (ref 3.87–5.11)
RDW: 15 % (ref 11.5–15.5)
WBC: 8.8 10*3/uL (ref 4.0–10.5)
nRBC: 0 % (ref 0.0–0.2)

## 2021-08-04 LAB — TYPE AND SCREEN
ABO/RH(D): O POS
Antibody Screen: NEGATIVE

## 2021-08-04 LAB — RESP PANEL BY RT-PCR (FLU A&B, COVID) ARPGX2
Influenza A by PCR: NEGATIVE
Influenza B by PCR: NEGATIVE
SARS Coronavirus 2 by RT PCR: NEGATIVE

## 2021-08-04 MED ORDER — OXYTOCIN 10 UNIT/ML IJ SOLN
INTRAMUSCULAR | Status: AC
  Filled 2021-08-04: qty 2

## 2021-08-04 MED ORDER — OXYCODONE-ACETAMINOPHEN 5-325 MG PO TABS: 2.0000 | ORAL_TABLET | ORAL | Status: DC | PRN

## 2021-08-04 MED ORDER — ONDANSETRON HCL 4 MG PO TABS
4.0000 mg | ORAL_TABLET | ORAL | Status: DC | PRN
  Filled 2021-08-04: qty 1

## 2021-08-04 MED ORDER — OXYTOCIN-SODIUM CHLORIDE 30-0.9 UT/500ML-% IV SOLN
INTRAVENOUS | Status: AC
  Administered 2021-08-04: 333 mL via INTRAVENOUS
  Filled 2021-08-04: qty 500

## 2021-08-04 MED ORDER — OXYTOCIN-SODIUM CHLORIDE 30-0.9 UT/500ML-% IV SOLN
2.5000 [IU]/h | INTRAVENOUS | Status: DC
  Administered 2021-08-04: 2.5 [IU]/h via INTRAVENOUS
  Filled 2021-08-04: qty 500

## 2021-08-04 MED ORDER — LURASIDONE HCL 40 MG PO TABS
40.0000 mg | ORAL_TABLET | Freq: Every day | ORAL | Status: DC
  Administered 2021-08-04: 40 mg via ORAL
  Filled 2021-08-04 (×2): qty 1

## 2021-08-04 MED ORDER — LIDOCAINE HCL (PF) 1 % IJ SOLN
INTRAMUSCULAR | Status: AC
  Filled 2021-08-04: qty 30

## 2021-08-04 MED ORDER — ONDANSETRON HCL 4 MG/2ML IJ SOLN: 4.0000 mg | Freq: Four times a day (QID) | INTRAMUSCULAR | Status: DC | PRN

## 2021-08-04 MED ORDER — DIBUCAINE (PERIANAL) 1 % EX OINT
1.0000 "application " | TOPICAL_OINTMENT | CUTANEOUS | Status: DC | PRN
  Administered 2021-08-04: 1 via RECTAL
  Filled 2021-08-04: qty 28

## 2021-08-04 MED ORDER — AMMONIA AROMATIC IN INHA
RESPIRATORY_TRACT | Status: AC
  Filled 2021-08-04: qty 10

## 2021-08-04 MED ORDER — FENTANYL-BUPIVACAINE-NACL 0.5-0.125-0.9 MG/250ML-% EP SOLN
EPIDURAL | Status: AC
  Filled 2021-08-04: qty 250

## 2021-08-04 MED ORDER — DOCUSATE SODIUM 100 MG PO CAPS
100.0000 mg | ORAL_CAPSULE | Freq: Two times a day (BID) | ORAL | Status: DC
  Administered 2021-08-04: 100 mg via ORAL
  Filled 2021-08-04 (×2): qty 1

## 2021-08-04 MED ORDER — LACTATED RINGERS IV SOLN: 500.0000 mL | INTRAVENOUS | Status: DC | PRN

## 2021-08-04 MED ORDER — ONDANSETRON HCL 4 MG/2ML IJ SOLN: 4.0000 mg | INTRAMUSCULAR | Status: DC | PRN

## 2021-08-04 MED ORDER — FENTANYL CITRATE (PF) 100 MCG/2ML IJ SOLN: 50.0000 ug | INTRAMUSCULAR | Status: DC | PRN

## 2021-08-04 MED ORDER — IBUPROFEN 600 MG PO TABS
600.0000 mg | ORAL_TABLET | Freq: Four times a day (QID) | ORAL | Status: DC
  Administered 2021-08-04 – 2021-08-05 (×3): 600 mg via ORAL
  Filled 2021-08-04 (×4): qty 1

## 2021-08-04 MED ORDER — COCONUT OIL OIL
1.0000 "application " | TOPICAL_OIL | Status: DC | PRN
  Administered 2021-08-04: 1 via TOPICAL
  Filled 2021-08-04: qty 120

## 2021-08-04 MED ORDER — SOD CITRATE-CITRIC ACID 500-334 MG/5ML PO SOLN: 30.0000 mL | ORAL | Status: DC | PRN

## 2021-08-04 MED ORDER — PHENYLEPHRINE 40 MCG/ML (10ML) SYRINGE FOR IV PUSH (FOR BLOOD PRESSURE SUPPORT): 80.0000 ug | PREFILLED_SYRINGE | INTRAVENOUS | Status: DC | PRN

## 2021-08-04 MED ORDER — LACTATED RINGERS IV SOLN: 500.0000 mL | Freq: Once | INTRAVENOUS | Status: DC

## 2021-08-04 MED ORDER — SIMETHICONE 80 MG PO CHEW
80.0000 mg | CHEWABLE_TABLET | ORAL | Status: DC | PRN
Start: 2021-08-04 — End: 2021-08-05

## 2021-08-04 MED ORDER — FENTANYL-BUPIVACAINE-NACL 0.5-0.125-0.9 MG/250ML-% EP SOLN
12.0000 mL/h | EPIDURAL | Status: DC | PRN
  Administered 2021-08-04: 12 mL/h via EPIDURAL

## 2021-08-04 MED ORDER — EPHEDRINE 5 MG/ML INJ: 10.0000 mg | INTRAVENOUS | Status: DC | PRN

## 2021-08-04 MED ORDER — DIPHENHYDRAMINE HCL 25 MG PO CAPS: 25.0000 mg | ORAL_CAPSULE | Freq: Four times a day (QID) | ORAL | Status: DC | PRN

## 2021-08-04 MED ORDER — OXYCODONE-ACETAMINOPHEN 5-325 MG PO TABS: 1.0000 | ORAL_TABLET | ORAL | Status: DC | PRN

## 2021-08-04 MED ORDER — LIDOCAINE HCL (PF) 1 % IJ SOLN: 30.0000 mL | INTRAMUSCULAR | Status: DC | PRN

## 2021-08-04 MED ORDER — LACTATED RINGERS IV SOLN: INTRAVENOUS | Status: DC

## 2021-08-04 MED ORDER — OXYTOCIN BOLUS FROM INFUSION
333.0000 mL | Freq: Once | INTRAVENOUS | Status: AC
Start: 2021-08-04 — End: 2021-08-04

## 2021-08-04 MED ORDER — ACETAMINOPHEN 500 MG PO TABS: 1000.0000 mg | ORAL_TABLET | Freq: Four times a day (QID) | ORAL | Status: DC | PRN

## 2021-08-04 MED ORDER — BUPIVACAINE HCL (PF) 0.25 % IJ SOLN
INTRAMUSCULAR | Status: DC | PRN
  Administered 2021-08-04: 4 mL via EPIDURAL

## 2021-08-04 MED ORDER — PHENYLEPHRINE 40 MCG/ML (10ML) SYRINGE FOR IV PUSH (FOR BLOOD PRESSURE SUPPORT)
80.0000 ug | PREFILLED_SYRINGE | INTRAVENOUS | Status: DC | PRN
Start: 2021-08-04 — End: 2021-08-04

## 2021-08-04 MED ORDER — WITCH HAZEL-GLYCERIN EX PADS
1.0000 | MEDICATED_PAD | CUTANEOUS | Status: DC
Start: 2021-08-04 — End: 2021-08-05
  Administered 2021-08-04: 1 via TOPICAL
  Filled 2021-08-04: qty 100

## 2021-08-04 MED ORDER — ACETAMINOPHEN 325 MG PO TABS: 650.0000 mg | ORAL_TABLET | ORAL | Status: DC | PRN

## 2021-08-04 MED ORDER — DIPHENHYDRAMINE HCL 50 MG/ML IJ SOLN
12.5000 mg | INTRAMUSCULAR | Status: DC | PRN
Start: 2021-08-04 — End: 2021-08-04

## 2021-08-04 MED ORDER — PRENATAL MULTIVITAMIN CH
1.0000 | ORAL_TABLET | Freq: Every day | ORAL | Status: DC
  Administered 2021-08-05: 1 via ORAL
  Filled 2021-08-04: qty 1

## 2021-08-04 MED ORDER — BENZOCAINE-MENTHOL 20-0.5 % EX AERO
1.0000 "application " | INHALATION_SPRAY | CUTANEOUS | Status: DC | PRN
  Administered 2021-08-04: 1 via TOPICAL
  Filled 2021-08-04: qty 56

## 2021-08-04 MED ORDER — LIDOCAINE-EPINEPHRINE (PF) 2 %-1:200000 IJ SOLN
INTRAMUSCULAR | Status: DC | PRN
  Administered 2021-08-04: 3 mL via INTRADERMAL

## 2021-08-04 MED ORDER — LEVOTHYROXINE SODIUM 125 MCG PO TABS
250.0000 ug | ORAL_TABLET | Freq: Every day | ORAL | Status: DC
  Administered 2021-08-04 – 2021-08-05 (×2): 250 ug via ORAL
  Filled 2021-08-04 (×4): qty 2

## 2021-08-04 MED ORDER — MISOPROSTOL 200 MCG PO TABS
ORAL_TABLET | ORAL | Status: AC
  Filled 2021-08-04: qty 4

## 2021-08-04 NOTE — ED Provider Notes (Signed)
[facility] Emergency Department Provider Note  ____________________________________________   None    (approximate)  I have reviewed the triage vital signs and the nursing notes.   HISTORY  Chief Complaint No chief complaint on file.   HPI Carol Gentry is a 20 y.o. female G3P1011 at [redacted]w[redacted]d having received routine prenatal care who presents for assessment of contractions.  She endorses some abdominal pain.  Triage nurse did request I evaluate patient prior to transfer to L&D to ensure she is not imminently delivering.  Patient denies any issues with pregnancy thus far or any other recent sick symptoms.         Past Medical History:  Diagnosis Date   Anemia    Hypothyroidism    Supervision of normal first teen pregnancy     Patient Active Problem List   Diagnosis Date Noted   Uterine contractions 07/27/2021   Vaginal bleeding in pregnancy, third trimester 07/16/2021   Complex ovarian cyst 07/16/2021   Pelvic pain in female 07/16/2021   Bipolar depression (HCC) 01/30/2021   Encounter for supervision of normal pregnancy in third trimester 01/16/2021   Hypothyroidism (acquired) 06/09/2018   Term pregnancy 06/30/2017   Cramping affecting pregnancy, antepartum 06/07/2017   Preterm uterine contractions 05/10/2017    Past Surgical History:  Procedure Laterality Date   FRACTURE SURGERY     ORIF TOE FRACTURE Right 11/15/2015   Procedure: OPEN REDUCTION INTERNAL FIXATION (ORIF) METATARSAL (TOE) FRACTURE;  Surgeon: Gwyneth Revels, DPM;  Location: [facility] ORS;  Service: Podiatry;  Laterality: Right;    Prior to Admission medications   Medication Sig Start Date End Date Taking? Authorizing Provider  aspirin 81 MG chewable tablet Chew by mouth daily.    [provider]  levothyroxine (SYNTHROID) 200 MCG tablet Take 200 mcg by mouth daily before breakfast.    [provider]  Prenatal Vit-Fe Fumarate-FA (PRENATAL MULTIVITAMIN) TABS  tablet Take 1 tablet by mouth daily at 12 noon.    [provider]    Allergies Amoxicillin  No family history on file.  Social History Social History   Tobacco Use   Smoking status: Never   Smokeless tobacco: Never  Substance Use Topics   Alcohol use: No   Drug use: No    Review of Systems  Review of Systems  Unable to perform ROS: Medical condition     ____________________________________________   PHYSICAL EXAM:  VITAL SIGNS: ED Triage Vitals  Enc Vitals Group     BP      Pulse      Resp      Temp      Temp src      SpO2      Weight      Height      Head Circumference      Peak Flow      Pain Score      Pain Loc      Pain Edu?      Excl. in GC?    There were no vitals filed for this visit. Physical Exam Vitals and nursing note reviewed. Exam conducted with a chaperone present.  Constitutional:      Appearance: She is ill-appearing.  HENT:     Right Ear: External ear normal.     Left Ear: External ear normal.     Nose: Nose normal.     Mouth/Throat:     Mouth: Mucous membranes are moist.  Cardiovascular:     Pulses: Normal  pulses.  Pulmonary:     Effort: No respiratory distress.  Abdominal:     General: There is distension.  Skin:    General: Skin is warm.     Capillary Refill: Capillary refill takes less than 2 seconds.  Neurological:     Mental Status: She is alert and oriented to person, place, and time.    Rapid GU exam without fetal head visible. ____________________________________________   LABS (all labs ordered are listed, but only abnormal results are displayed)  Labs Reviewed - No data to display ____________________________________________  EKG  ____________________________________________  RADIOLOGY  ED MD interpretation:    Official radiology report(s): No results found.  ____________________________________________   PROCEDURES  Procedure(s) performed (including Critical  Care):  Procedures   ____________________________________________   INITIAL IMPRESSION / ASSESSMENT AND PLAN / ED COURSE      Patient presents in apparent labor as noted above.  She is established with OB.  Triage nurse requested that I rapidly evaluate patient to determine if there is time for her to go to L&D for.  Do not see evidence of imminent delivery on my exam.  Patient immediately transferred to L&D.        ____________________________________________   FINAL CLINICAL IMPRESSION(S) / ED DIAGNOSES  Final diagnoses:  Uterine contractions  Pregnancy, unspecified gestational age    Medications - No data to display   ED Discharge Orders     None        Note:  This document was prepared using Dragon voice recognition software and may include unintentional dictation errors.    Gilles Chiquito, MD 08/04/21 [phone]

## 2021-08-04 NOTE — H&P (Signed)
OB History & Physical   History of Present Illness:   Chief Complaint: regular, painful contractions   HPI:  Carol Gentry is a 20 y.o. G16P1011 female at [redacted]w[redacted]d dated by [place] at [redacted]w[redacted]d, not c/w LMP of 10/11/2020, with and Estimated Date of Delivery: 08/05/21.  She presents to L&D for regular, painful contractions that started around 0530 this morning.  They have become progressively worse.  Denies LOF or vaginal bleeding.  Endorses good fetal movement.   Reports active fetal movement  Contractions: every 2 to 3 minutes LOF/SROM: denies  Vaginal bleeding: denies   Factors complicating pregnancy:  Bipolar depression - currently taking Latuda  Hypothyroidism - Synthroid 250 mcg History of preeclampsia in previous pregnancy  Palpitations in pregnancy - cardiology referral, sinus arrhythmia   Patient Active Problem List   Diagnosis Date Noted   Normal labor 08/04/2021   Vaginal bleeding in pregnancy, third trimester 07/16/2021   Complex ovarian cyst 07/16/2021   Bipolar depression (HCC) 01/30/2021   Encounter for supervision of normal pregnancy in third trimester 01/16/2021   Hypothyroidism (acquired) 06/09/2018     Maternal Medical History:   Past Medical History:  Diagnosis Date   Anemia    Hypothyroidism    Supervision of normal first teen pregnancy     Past Surgical History:  Procedure Laterality Date   FRACTURE SURGERY     ORIF TOE FRACTURE Right 11/15/2015   Procedure: OPEN REDUCTION INTERNAL FIXATION (ORIF) METATARSAL (TOE) FRACTURE;  Surgeon: Gwyneth Revels, DPM;  Location: [facility] ORS;  Service: Podiatry;  Laterality: Right;    Allergies  Allergen Reactions   Amoxicillin Hives    Prior to Admission medications   Medication Sig Start Date End Date Taking? Authorizing Provider  levothyroxine (SYNTHROID) 200 MCG tablet Take 200 mcg by mouth daily before breakfast.   Yes [provider]  aspirin 81 MG chewable tablet Chew by mouth daily.    [provider]  Prenatal Vit-Fe Fumarate-FA (PRENATAL MULTIVITAMIN) TABS tablet Take 1 tablet by mouth daily at 12 noon.    [provider]     Prenatal care site:  [facility]  Social History: She  reports that she has never smoked. She has never used smokeless tobacco. She reports that she does not drink alcohol and does not use drugs.  Family History: family history is not on file.   Review of Systems: A full review of systems was performed and negative except as noted in the HPI.     Physical Exam:  Vital Signs: There were no vitals taken for this visit. Physical Exam  General: no acute distress.  HEENT: normocephalic, atraumatic Heart: regular rate & rhythm.  No murmurs/rubs/gallops Lungs: clear to auscultation bilaterally, normal respiratory effort Abdomen: soft, gravid, non-tender;  EFW: 7 1/2 Pelvic:   External: Normal external female genitalia  Cervix: 7/90/-1/bulging membranes    Extremities: non-tender, symmetric, No edema bilaterally.  DTRs: 2+/2+  Neurologic: Alert & oriented x 3.    No results found for this or any previous visit (from the past 24 hour(s)).  Pertinent Results:  Prenatal Labs: Blood type/Rh O pos   Antibody screen neg  Rubella Immune  Varicella Immune  RPR NR  HBsAg Neg  HIV NR  GC neg  Chlamydia neg  Genetic screening cfDNA negative   1 hour GTT 74  3 hour GTT N/A  GBS Neg   FHT:  FHR: 130 bpm, variability: moderate,  accelerations:  Present,  decelerations:  Absent Category/reactivity:  Category I UC:   regular, every 3-5 minutes   Cephalic by [place] and SVE   No results found.  Assessment:  Carol Gentry is a 20 y.o. G72P1011 female at [redacted]w[redacted]d with Normal labor.   Plan:  1. Admit to Labor & Delivery; consents reviewed and obtained - Covid admission screen   2. Fetal Well being  - Fetal Tracing: Cat 1 - Group B Streptococcus ppx not indicated: GBS neg - Note on chart states that she was GBS positive in a urine  culture on 07/21/2021 and treated with Macrobid.  Urine culture from that date states that no GBS was present in urine.  GBS culture done on 07/09/2021 was GBS neg - Presentation: Cephalic confirmed by bedside US    3. Routine OB: - Prenatal labs reviewed, as above - Rh pos - CBC, T&S, RPR on admit - Clear fluids, IVF   4. Monitoring of labor  - Contractions monitored with external toco - Pelvis proven to 3799g  - Plan for expectant management  - Augmentation with oxytocin and AROM as appropriate  - Plan for  continuous fetal monitoring - Maternal pain control as desired; planning regional anesthesia - Anticipate vaginal delivery  5. Post Partum Planning: - Infant feeding: breast - Contraception: TBD - Tdap vaccine: given 05/15/2021 - Flu vaccine: given 04/17/2021  Gustavo Lah, CNM 08/04/21 8:32 AM  Margaretmary Eddy, CNM Certified Nurse Midwife [facility]

## 2021-08-04 NOTE — Anesthesia Procedure Notes (Signed)
Epidural Patient location during procedure: OB Start time: 08/04/2021 9:18 AM End time: 08/04/2021 9:20 AM  Staffing Anesthesiologist: Alver Fisher, MD Resident/CRNA: Ginger Carne, CRNA Performed: resident/CRNA   Preanesthetic Checklist Completed: patient identified, IV checked, site marked, risks and benefits discussed, surgical consent, monitors and equipment checked, pre-op evaluation and timeout performed  Epidural Patient position: sitting Prep: ChloraPrep Patient monitoring: heart rate, continuous pulse ox and blood pressure Approach: midline Location: L3-L4 Injection technique: LOR saline  Needle:  Needle type: Tuohy  Needle gauge: 17 G Needle length: 9 cm and 9 Needle insertion depth: 6 cm Catheter type: closed end flexible Catheter size: 19 Gauge Catheter at skin depth: 12 cm Test dose: negative and 1.5% lidocaine with Epi 1:200 K  Assessment Sensory level: T10 Events: blood not aspirated, injection not painful, no injection resistance, no paresthesia and negative IV test  Additional Notes 1 attempt Pt. Evaluated and documentation done after procedure finished. Patient identified. Risks/Benefits/Options discussed with patient including but not limited to bleeding, infection, nerve damage, paralysis, failed block, incomplete pain control, headache, blood pressure changes, nausea, vomiting, reactions to medication both or allergic, itching and postpartum back pain. Confirmed with bedside nurse the patient's most recent platelet count. Confirmed with patient that they are not currently taking any anticoagulation, have any bleeding history or any family history of bleeding disorders. Patient expressed understanding and wished to proceed. All questions were answered. Sterile technique was used throughout the entire procedure. Please see nursing notes for vital signs. Test dose was given through epidural catheter and negative prior to continuing to dose epidural or start  infusion. Warning signs of high block given to the patient including shortness of breath, tingling/numbness in hands, complete motor block, or any concerning symptoms with instructions to call for help. Patient was given instructions on fall risk and not to get out of bed. All questions and concerns addressed with instructions to call with any issues or inadequate analgesia.    Patient tolerated the insertion well without immediate complications.Reason for block:procedure for pain

## 2021-08-04 NOTE — Discharge Instructions (Signed)
Vaginal Delivery, Care After Refer to this sheet in the next few weeks. These discharge instructions provide you with information on caring for yourself after delivery. Your caregiver may also give you specific instructions. Your treatment has been planned according to the most current medical practices available, but problems sometimes occur. Call your caregiver if you have any problems or questions after you go home. HOME CARE INSTRUCTIONS Take over-the-counter or prescription medicines only as directed by your caregiver or pharmacist. Do not drink alcohol, especially if you are breastfeeding or taking medicine to relieve pain. Do not smoke tobacco. Continue to use good perineal care. Good perineal care includes: Wiping your perineum from back to front Keeping your perineum clean. You can do sitz baths twice a day, to help keep this area clean Do not use tampons, douche or have sex until your caregiver says it is okay. Shower only and avoid sitting in submerged water, aside from sitz baths Wear a well-fitting bra that provides breast support. Eat healthy foods. Drink enough fluids to keep your urine clear or pale yellow. Eat high-fiber foods such as whole grain cereals and breads, brown rice, beans, and fresh fruits and vegetables every day. These foods may help prevent or relieve constipation. Avoid constipation with high fiber foods or medications, such as miralax or metamucil Follow your caregiver's recommendations regarding resumption of activities such as climbing stairs, driving, lifting, exercising, or traveling. Talk to your caregiver about resuming sexual activities. Resumption of sexual activities is dependent upon your risk of infection, your rate of healing, and your comfort and desire to resume sexual activity. Try to have someone help you with your household activities and your newborn for at least a few days after you leave the hospital. Rest as much as possible. Try to rest or  take a nap when your newborn is sleeping. Increase your activities gradually. Keep all of your scheduled postpartum appointments. It is very important to keep your scheduled follow-up appointments. At these appointments, your caregiver will be checking to make sure that you are healing physically and emotionally. SEEK MEDICAL CARE IF:  You are passing large clots from your vagina. Save any clots to show your caregiver. You have a foul smelling discharge from your vagina. You have trouble urinating. You are urinating frequently. You have pain when you urinate. You have a change in your bowel movements. You have increasing redness, pain, or swelling near your vaginal incision (episiotomy) or vaginal tear. You have pus draining from your episiotomy or vaginal tear. Your episiotomy or vaginal tear is separating. You have painful, hard, or reddened breasts. You have a severe headache. You have blurred vision or see spots. You feel sad or depressed. You have thoughts of hurting yourself or your newborn. You have questions about your care, the care of your newborn, or medicines. You are dizzy or light-headed. You have a rash. You have nausea or vomiting. You were breastfeeding and have not had a menstrual period within 12 weeks after you stopped breastfeeding. You are not breastfeeding and have not had a menstrual period by the 12th week after delivery. You have a fever. SEEK IMMEDIATE MEDICAL CARE IF:  You have persistent pain. You have chest pain. You have shortness of breath. You faint. You have leg pain. You have stomach pain. Your vaginal bleeding saturates two or more sanitary pads in 1 hour. MAKE SURE YOU:  Understand these instructions. Will watch your condition. Will get help right away if you are not doing well or   get worse. Document Released: 12/11/2000 Document Revised: 04/30/2014 Document Reviewed: 08/10/2012 ExitCare Patient Information 2015 [employer]. This  information is not intended to replace advice given to you by your health care provider. Make sure you discuss any questions you have with your health care provider.  Sitz Bath A sitz bath is a warm water bath taken in the sitting position. The water covers only the hips and butt (buttocks). We recommend using one that fits in the toilet, to help with ease of use and cleanliness. It may be used for either healing or cleaning purposes. Sitz baths are also used to relieve pain, itching, or muscle tightening (spasms). The water may contain medicine. Moist heat will help you heal and relax.  HOME CARE  Take 3 to 4 sitz baths a day. Fill the bathtub half-full with warm water. Sit in the water and open the drain a little. Turn on the warm water to keep the tub half-full. Keep the water running constantly. Soak in the water for 15 to 20 minutes. After the sitz bath, pat the affected area dry. GET HELP RIGHT AWAY IF: You get worse instead of better. Stop the sitz baths if you get worse. MAKE SURE YOU: Understand these instructions. Will watch your condition. Will get help right away if you are not doing well or get worse. Document Released: 01/21/2005 Document Revised: 09/07/2012 Document Reviewed: 04/13/2011 ExitCare Patient Information 2015 [employer]. This information is not intended to replace advice given to you by your health care provider. Make sure you discuss any questions you have with your health care provider.   

## 2021-08-04 NOTE — Anesthesia Preprocedure Evaluation (Signed)
Anesthesia Evaluation  Patient identified by MRN, date of birth, ID band Patient awake    Reviewed: Allergy & Precautions, H&P , Patient's Chart, lab work & pertinent test results  History of Anesthesia Complications Negative for: history of anesthetic complications  Airway Mallampati: II  TM Distance: >3 FB Neck ROM: full    Dental  (+) Missing, Chipped, Poor Dentition   Pulmonary neg pulmonary ROS,    Pulmonary exam normal        Cardiovascular negative cardio ROS Normal cardiovascular exam     Neuro/Psych PSYCHIATRIC DISORDERS Bipolar Disorder negative neurological ROS     GI/Hepatic negative GI ROS, Neg liver ROS,   Endo/Other  Hypothyroidism   Renal/GU negative Renal ROS  negative genitourinary   Musculoskeletal   Abdominal   Peds  Hematology  (+) Blood dyscrasia, anemia ,   Anesthesia Other Findings   Reproductive/Obstetrics (+) Pregnancy                             Anesthesia Physical Anesthesia Plan  ASA: 2  Anesthesia Plan: Epidural   Post-op Pain Management:    Induction:   PONV Risk Score and Plan:   Airway Management Planned:   Additional Equipment:   Intra-op Plan:   Post-operative Plan:   Informed Consent: I have reviewed the patients History and Physical, chart, labs and discussed the procedure including the risks, benefits and alternatives for the proposed anesthesia with the patient or authorized representative who has indicated his/her understanding and acceptance.       Plan Discussed with: Anesthesiologist  Anesthesia Plan Comments:         Anesthesia Quick Evaluation

## 2021-08-04 NOTE — Progress Notes (Signed)
Labor Progress Note  Carol Gentry is a 20 y.o. G3P1011 at [redacted]w[redacted]d by ultrasound admitted for active labor  Subjective: feeling more comfortable with epidural  Objective: BP 103/70   Pulse 85   Temp 98.3 F (36.8 C) (Oral)   Resp 20   Ht 5\' 1"  (1.549 m)   Wt 75.3 kg   SpO2 99%   BMI 31.37 kg/m  Notable VS details: reviewed   Fetal Assessment: FHT:  FHR: 130 bpm, variability: moderate,  accelerations:  Present,  decelerations:  Present variable x 1 Category/reactivity:  Category II UC:   regular, every 3-5 minutes SVE:   8-9/90/-1 Membrane status: AROM at 0954 for large amount of clear fluid Amniotic color: Clear  Labs: Lab Results  Component Value Date   WBC 8.8 08/04/2021   HGB 12.2 08/04/2021   HCT 37.7 08/04/2021   MCV 73.5 (L) 08/04/2021   PLT 308 08/04/2021    Assessment / Plan: Spontaneous labor, progressing normally  Labor: Progressing normally Fetal Wellbeing:  Category II for variable x 1.  Overall reassuring with moderate variability and accels  Pain Control:  Epidural I/D:  n/a Anticipated MOD:  NSVD  10/04/2021, CNM 08/04/2021, 10:05 AM

## 2021-08-04 NOTE — Discharge Summary (Signed)
Obstetrical Discharge Summary  Patient Name: Carol Gentry DOB: 2001-05-26 MRN: [id number]  Date of Admission: 08/04/2021 Date of Delivery: 08/04/2021 Delivered by: Margaretmary Eddy, CNM  Date of Discharge: 08/05/2021  Primary OB: [facility] [id number] last menstrual period was 10/11/2020 (exact date). EDC Estimated Date of Delivery: 08/05/21 Gestational Age at Delivery: [redacted]w[redacted]d   Antepartum complications:  Bipolar depression - currently taking Latuda Hypothyroidism - Synthroid 250 mcg History of preeclampsia in previous pregnancy Palpitations in pregnancy - cardiology referral, sinus arrhythmia   Admitting Diagnosis: Normal labor  Secondary Diagnosis: Patient Active Problem List   Diagnosis Date Noted   NSVD (normal spontaneous vaginal delivery) 08/05/2021   Vaginal bleeding in pregnancy, third trimester 07/16/2021   Complex ovarian cyst 07/16/2021   Bipolar depression (HCC) 01/30/2021   Hypothyroidism (acquired) 06/09/2018    Augmentation: AROM Complications: None Intrapartum complications/course: Carol Gentry presented to L&D with regular, painful contractions in advanced labor.  She was expectantly managed, AROM for large amount of clear fluid after epidural.  She progressed well to C/C/+2 with an urge to push.  She pushed effectively over approximately 20 minutes (4 contractions over 20 minutes) for a spontaneous vaginal birth.   Delivery Type: spontaneous vaginal delivery Anesthesia: epidural Placenta: spontaneous Laceration: None - small 2cm by 2cm hematoma noted at introitus  Episiotomy: none Newborn Data: Live born female:  Birth Weight:  3610g 7lb15.3oz APGAR: 8, 9   Newborn Delivery   Birth date/time: 08/04/2021 11:32:00 Delivery type: Vaginal, Spontaneous     Postpartum Procedures: none Edinburgh:  Edinburgh Postnatal Depression Scale Screening Tool 08/05/2021  I have been able to laugh and see the funny side of things. (No Data)     Post partum course:   Patient had an uncomplicated postpartum course.  By time of discharge on PPD#1, her pain was controlled on oral pain medications; she had appropriate lochia and was ambulating, voiding without difficulty and tolerating regular diet.  She was deemed stable for discharge to home.     Discharge Physical Exam:  BP (!) 104/48 (BP Location: Left Arm)   Pulse 79   Temp 97.6 F (36.4 C) (Oral)   Resp 20   Ht 5\' 1"  (1.549 m)   Wt 75.3 kg   LMP 10/11/2020 (Exact Date)   SpO2 100%   Breastfeeding Unknown   BMI 31.37 kg/m   General: NAD CV: RRR Pulm: CTABL, nl effort ABD: s/nd/nt, fundus firm and below the umbilicus Lochia: moderate Perineum: minimal edema/intact, laceration hemostatic; Vaginal Hematoma none DVT Evaluation: LE non-ttp, no evidence of DVT on exam.  Hemoglobin  Date Value Ref Range Status  08/05/2021 10.7 (L) 12.0 - 15.0 g/dL Final   HCT  Date Value Ref Range Status  08/05/2021 33.8 (L) 36.0 - 46.0 % Final     Disposition: stable, discharge to home. Baby Feeding: breastmilk Baby Disposition: home with mom  Rh Immune globulin given: Rh pos  Rubella vaccine given: immune Varivax vaccine given: immune Flu vaccine given in AP or PP setting: given 05/15/2021 Tdap vaccine given in AP or PP setting: given 04/17/2021  Contraception: Postpartum interval BTL   Prenatal Labs:  Blood type/Rh O pos   Antibody screen neg  Rubella Immune  Varicella Immune  RPR NR  HBsAg Neg  HIV NR  GC neg  Chlamydia neg  Genetic screening cfDNA negative  1 hour GTT 74  3 hour GTT N/A  GBS Neg    Plan:  [name] was discharged to home in good  condition. Follow-up appointment with MD for pre-op for BTL and with delivering provider in 6 weeks.  Discharge Medications: Allergies as of 08/05/2021       Reactions   Amoxicillin Hives        Medication List     TAKE these medications    acetaminophen 500 MG tablet Commonly known as: TYLENOL Take 2 tablets (1,000 mg  total) by mouth every 6 (six) hours as needed (for pain scale < 4).   docusate sodium 100 MG capsule Commonly known as: COLACE Take 1 capsule (100 mg total) by mouth daily as needed for mild constipation.   ferrous sulfate 325 (65 FE) MG tablet Take 1 tablet (325 mg total) by mouth daily with breakfast.   ibuprofen 600 MG tablet Commonly known as: ADVIL Take 1 tablet (600 mg total) by mouth every 6 (six) hours as needed.   levothyroxine 200 MCG tablet Commonly known as: SYNTHROID Take 200 mcg by mouth daily before breakfast.   levothyroxine 50 MCG tablet Commonly known as: SYNTHROID Take 50 mcg by mouth daily before breakfast.   lurasidone 40 MG Tabs tablet Commonly known as: LATUDA Take 40 mg by mouth daily.   prenatal multivitamin Tabs tablet Take 1 tablet by mouth daily at 12 noon.         Follow-up Information     Gustavo Lah, CNM. Schedule an appointment as soon as possible for a visit in 6 week(s).   Specialty: Certified Nurse Midwife Why: postpartum visit Contact information: [address] [place] [address] [phone]         [facility]. Schedule an appointment as soon as possible for a visit in 2 week(s).   Why: with MD for pre-op and discuss BTL Contact information: [address] [place] [address] [phone]                Signed: Cyril Mourning  08/05/2021 2:10 PM

## 2021-08-05 LAB — RPR: RPR Ser Ql: NONREACTIVE

## 2021-08-05 LAB — CBC
HCT: 33.8 % — ABNORMAL LOW (ref 36.0–46.0)
Hemoglobin: 10.7 g/dL — ABNORMAL LOW (ref 12.0–15.0)
MCH: 23.8 pg — ABNORMAL LOW (ref 26.0–34.0)
MCHC: 31.7 g/dL (ref 30.0–36.0)
MCV: 75.1 fL — ABNORMAL LOW (ref 80.0–100.0)
Platelets: 268 10*3/uL (ref 150–400)
RBC: 4.5 MIL/uL (ref 3.87–5.11)
RDW: 14.9 % (ref 11.5–15.5)
WBC: 11.9 10*3/uL — ABNORMAL HIGH (ref 4.0–10.5)
nRBC: 0 % (ref 0.0–0.2)

## 2021-08-05 MED ORDER — DOCUSATE SODIUM 100 MG PO CAPS: 100.0000 mg | ORAL_CAPSULE | Freq: Every day | ORAL | Status: AC | PRN

## 2021-08-05 MED ORDER — ACETAMINOPHEN 500 MG PO TABS: 1000.0000 mg | ORAL_TABLET | Freq: Four times a day (QID) | ORAL | Status: AC | PRN

## 2021-08-05 MED ORDER — IBUPROFEN 600 MG PO TABS: 600.0000 mg | ORAL_TABLET | Freq: Four times a day (QID) | ORAL | Status: AC | PRN

## 2021-08-05 MED ORDER — FERROUS SULFATE 325 (65 FE) MG PO TABS: 325.0000 mg | ORAL_TABLET | Freq: Every day | ORAL | Status: AC

## 2021-08-05 MED ORDER — IBUPROFEN 600 MG PO TABS
600.0000 mg | ORAL_TABLET | Freq: Four times a day (QID) | ORAL | Status: DC
  Administered 2021-08-05: 600 mg via ORAL
  Filled 2021-08-05 (×2): qty 1

## 2021-08-05 MED ORDER — FERROUS SULFATE 325 (65 FE) MG PO TABS: 325.0000 mg | ORAL_TABLET | Freq: Two times a day (BID) | ORAL | Status: DC

## 2021-08-05 NOTE — Progress Notes (Signed)
Post Partum Day 1  Subjective: Doing well, no concerns. Ambulating without difficulty, pain managed with PO meds, tolerating regular diet, and voiding without difficulty.   No fever/chills, chest pain, shortness of breath, nausea/vomiting, or leg pain. No nipple or breast pain. No headache, visual changes, or RUQ/epigastric pain.  Objective: BP (!) 106/59 (BP Location: Right Arm)   Pulse 93   Temp 97.6 F (36.4 C) (Oral)   Resp 16   Ht 5\' 1"  (1.549 m)   Wt 75.3 kg   LMP 10/11/2020 (Exact Date)   SpO2 98%   Breastfeeding Unknown   BMI 31.37 kg/m    Physical Exam:  General: alert and cooperative Breasts: soft/nontender CV: RRR Pulm: nl effort Abdomen: soft, non-tender Uterine Fundus: firm Incision: n/a Perineum: minimal edema, intact Lochia: appropriate DVT Evaluation: No evidence of DVT seen on physical exam. Edinburgh: No flowsheet data found.   Recent Labs    08/04/21 0830 08/05/21 0517  HGB 12.2 10.7*  HCT 37.7 33.8*  WBC 8.8 11.9*  PLT 308 268    Assessment/Plan: 20 y.o. 36 postpartum day # 1  -Continue routine postpartum care -Lactation consult PRN for breastfeeding   -Acute blood loss anemia - hemodynamically stable and asymptomatic; start PO ferrous sulfate BID with stool softeners  -Immunization status:  all immunizations up to date  Disposition: Continue inpatient postpartum care. Desires discharge home today   LOS: 1 day   Carol Gentry, CNM 08/05/2021, 8:29 AM

## 2021-08-05 NOTE — Progress Notes (Signed)
Patient discharged home with family.  Discharge instructions, when to follow up, and medications reviewed with patient.  Patient verbalized understanding. Patient will be escorted out by auxiliary.   

## 2021-08-05 NOTE — Lactation Note (Signed)
This note was copied from a baby's chart. Lactation Consultation Note  Patient Name: Carol Gentry [id number] Date: 08/05/2021 Reason for consult: Initial assessment;Term Age:22 hours  Initial lactation visit for P2 mother who delivered SVD 22 hours ago. Previous BF hx of 2 months- has 22yr old, was in high school and had to return to school.  Several feedings, voids and stools documented. Circumcision performed this morning, 24hr testing results pending.  Mom has c/o sore/tender nipples. No visible damage noted, and she states the discomfort eases up throughout feeding. Mom has been given coconut oil. LC provided comfort gels and educated on rinsing, storage in fridge, and alternating use w/ coconut oil.  BF basic education given: newborn stomach size, feeding patterns and behaviors, cluster feeding, early cues, and feeding on demand. Discussed once 24 hrs 8 or more feedings every 24 hours, reviewed hand expression (colostrum easily expressed), and timing of pumping if needed for return to work/school.  Mom had no additional questions at this time. Whiteboard updated; mom encouraged to call for BF support as needed.  Maternal Data Has patient been taught Hand Expression?: Yes Does the patient have breastfeeding experience prior to this delivery?: Yes How long did the patient breastfeed?: 2 months  Feeding Mother's Current Feeding Choice: Breast Milk  LATCH Score                    Lactation Tools Discussed/Used Tools: Comfort gels;Coconut oil  Interventions Interventions: Breast feeding basics reviewed;Hand express;Comfort gels;Coconut oil;Education  Discharge    Consult Status Consult Status: Follow-up Date: 08/05/21 Follow-up type: Call as needed    Danford Bad 08/05/2021, 10:09 AM

## 2021-08-06 NOTE — Anesthesia Postprocedure Evaluation (Signed)
Anesthesia Post Note  Patient: Carol Gentry  Procedure(s) Performed: AN AD HOC LABOR EPIDURAL  Patient location during evaluation: Mother Baby Anesthesia Type: Epidural Level of consciousness: awake and alert and oriented Pain management: pain level controlled Vital Signs Assessment: post-procedure vital signs reviewed and stable Respiratory status: spontaneous breathing, nonlabored ventilation and respiratory function stable Cardiovascular status: stable Postop Assessment: no headache, no backache, no signs of nausea or vomiting and able to ambulate (no pruritis) Anesthetic complications: no   No notable events documented.   Last Vitals: There were no vitals filed for this visit.  Last Pain: There were no vitals filed for this visit.               Jakell Trusty

## 2023-07-23 ENCOUNTER — Other Ambulatory Visit: Payer: Self-pay

## 2023-07-23 ENCOUNTER — Emergency Department
Admission: EM | Admit: 2023-07-23 | Discharge: 2023-07-23 | Disposition: A | Discharge status: Home / Self Care | Payer: BC Managed Care – PPO | Attending: Emergency Medicine | Admitting: Emergency Medicine

## 2023-07-23 ENCOUNTER — Emergency Department: Payer: BC Managed Care – PPO

## 2023-07-23 DIAGNOSIS — N939 Abnormal uterine and vaginal bleeding, unspecified: Secondary | ICD-10-CM | POA: Diagnosis present

## 2023-07-23 DIAGNOSIS — R102 Pelvic and perineal pain: Secondary | ICD-10-CM | POA: Insufficient documentation

## 2023-07-23 LAB — BASIC METABOLIC PANEL
Anion gap: 8 (ref 5–15)
BUN: 6 mg/dL (ref 6–20)
CO2: 23 mmol/L (ref 22–32)
Calcium: 8.8 mg/dL — ABNORMAL LOW (ref 8.9–10.3)
Chloride: 107 mmol/L (ref 98–111)
Creatinine, Ser: 0.76 mg/dL (ref 0.44–1.00)
GFR, Estimated: >60 mL/min (ref >60)
Glucose, Bld: 94 mg/dL (ref 70–99)
Potassium: 4 mmol/L (ref 3.5–5.1)
Sodium: 138 mmol/L (ref 135–145)

## 2023-07-23 LAB — CBC
HCT: 41 % (ref 36.0–46.0)
Hemoglobin: 12.9 g/dL (ref 12.0–15.0)
MCH: 24.6 pg — ABNORMAL LOW (ref 26.0–34.0)
MCHC: 31.5 g/dL (ref 30.0–36.0)
MCV: 78.1 fL — ABNORMAL LOW (ref 80.0–100.0)
Platelets: 308 10*3/uL (ref 150–400)
RBC: 5.25 MIL/uL — ABNORMAL HIGH (ref 3.87–5.11)
RDW: 16.2 % — ABNORMAL HIGH (ref 11.5–15.5)
WBC: 6.9 10*3/uL (ref 4.0–10.5)
nRBC: 0 % (ref 0.0–0.2)

## 2023-07-23 LAB — TYPE AND SCREEN
ABO/RH(D): O POS
Antibody Screen: NEGATIVE

## 2023-07-23 LAB — POC URINE PREG, ED: Preg Test, Ur: NEGATIVE

## 2023-07-23 NOTE — ED Triage Notes (Signed)
Pt c/o abd pain and vaginal bleeding x3 weeks with increasing pain.

## 2023-07-23 NOTE — ED Provider Notes (Signed)
[facility] Provider Note    Event Date/Time   First MD Initiated Contact with Patient 07/23/23 1345     (approximate)   History   Abdominal Pain   HPI  Carol Gentry is a 22 y.o. female who reports she started to have vaginal bleeding while on NuvaRing last week, she thought it was because it was time for her withdrawal bleed, she removed her NuvaRing for a week, had some fairly heavy bleeding, reinserted NuvaRing and continue to have some spotting.  She decided to remove the NuvaRing because it was not "working ".  She is here now with continued vaginal bleeding.  Some lower abdominal cramping     Physical Exam   Triage Vital Signs: ED Triage Vitals  Encounter Vitals Group     BP 07/23/23 1339 123/72     Systolic BP Percentile --      Diastolic BP Percentile --      Pulse Rate 07/23/23 1339 83     Resp 07/23/23 1339 16     Temp 07/23/23 1339 98.3 F (36.8 C)     Temp Source 07/23/23 1339 Oral     SpO2 07/23/23 1339 97 %     Weight 07/23/23 1340 52.2 kg (115 lb)     Height 07/23/23 1340 1.549 m (5\' 1" )     Head Circumference --      Peak Flow --      Pain Score 07/23/23 1340 8     Pain Loc --      Pain Education --      Exclude from Growth Chart --     Most recent vital signs: Vitals:   07/23/23 1339 07/23/23 1734  BP: 123/72 106/67  Pulse: 83 78  Resp: 16 15  Temp: 98.3 F (36.8 C)   SpO2: 97% 97%     General: Awake, no distress.  CV:  Good peripheral perfusion.  Resp:  Normal effort.  Abd:  No distention.  Other:     ED Results / Procedures / Treatments   Labs (all labs ordered are listed, but only abnormal results are displayed) Labs Reviewed  CBC - Abnormal; Notable for the following components:      Result Value   RBC 5.25 (*)    MCV 78.1 (*)    MCH 24.6 (*)    RDW 16.2 (*)    All other components within normal limits  BASIC METABOLIC PANEL - Abnormal; Notable for the following components:   Calcium 8.8 (*)     All other components within normal limits  POC URINE PREG, ED  TYPE AND SCREEN     EKG     RADIOLOGY Ultrasound pelvis    PROCEDURES:  Critical Care performed:   Procedures   MEDICATIONS ORDERED IN ED: Medications - No data to display   IMPRESSION / MDM / ASSESSMENT AND PLAN / ED COURSE  I reviewed the triage vital signs and the nursing notes. Patient's presentation is most consistent with acute illness / injury with system symptoms.  Patient presents with vaginal bleeding as detailed above, likely breakthrough bleeding, possible hemorrhagic cyst, fibroids.  CBC checked, hemoglobin is stable and reassuring.  Will send for ultrasound  Have counseled patient to use NuvaRing as hormonal therapy to control bleeding, outpatient follow-up with GYN  Ultrasound is unremarkable,      FINAL CLINICAL IMPRESSION(S) / ED DIAGNOSES   Final diagnoses:  Abnormal uterine bleeding     Rx / DC  Orders   ED Discharge Orders     None        Note:  This document was prepared using Dragon voice recognition software and may include unintentional dictation errors.   Jene Every, MD 07/25/23 [phone]

## 2023-07-23 NOTE — ED Notes (Signed)
See triage note  Presents with some abnormal vaginal bleeding  States bleeding is heavy and started 3 weeks ago

## 2023-07-23 NOTE — ED Notes (Signed)
POC was negative
